# Patient Record
Sex: Female | Born: 1955 | Race: White | Hispanic: No | Marital: Married | Smoking: Never smoker
Health system: Southern US, Community
[De-identification: ages and names within clinical notes are randomized; demographics above are authoritative.]

## PROBLEM LIST (undated history)

## (undated) DIAGNOSIS — E785 Hyperlipidemia, unspecified: Secondary | ICD-10-CM

## (undated) DIAGNOSIS — E039 Hypothyroidism, unspecified: Secondary | ICD-10-CM

## (undated) DIAGNOSIS — T7840XA Allergy, unspecified, initial encounter: Secondary | ICD-10-CM

## (undated) DIAGNOSIS — Z800 Family history of malignant neoplasm of digestive organs: Secondary | ICD-10-CM

## (undated) DIAGNOSIS — E079 Disorder of thyroid, unspecified: Secondary | ICD-10-CM

## (undated) DIAGNOSIS — M069 Rheumatoid arthritis, unspecified: Secondary | ICD-10-CM

## (undated) DIAGNOSIS — R569 Unspecified convulsions: Secondary | ICD-10-CM

## (undated) DIAGNOSIS — G40909 Epilepsy, unspecified, not intractable, without status epilepticus: Secondary | ICD-10-CM

## (undated) HISTORY — DX: Family history of malignant neoplasm of digestive organs: Z80.0

## (undated) HISTORY — DX: Disorder of thyroid, unspecified: E07.9

## (undated) HISTORY — DX: Epilepsy, unspecified, not intractable, without status epilepticus: G40.909

## (undated) HISTORY — DX: Allergy, unspecified, initial encounter: T78.40XA

## (undated) HISTORY — PX: APPENDECTOMY: SHX54

## (undated) HISTORY — PX: COLONOSCOPY: SHX174

## (undated) HISTORY — PX: POLYPECTOMY: SHX149

## (undated) HISTORY — DX: Rheumatoid arthritis, unspecified: M06.9

## (undated) HISTORY — DX: Hyperlipidemia, unspecified: E78.5

---

## 1998-02-24 ENCOUNTER — Other Ambulatory Visit: Admission: RE | Admit: 1998-02-24 | Discharge: 1998-02-24 | Payer: Self-pay | Admitting: Family Medicine

## 1998-10-26 ENCOUNTER — Other Ambulatory Visit: Admission: RE | Admit: 1998-10-26 | Discharge: 1998-10-26 | Payer: Self-pay | Admitting: Obstetrics and Gynecology

## 2000-01-10 ENCOUNTER — Other Ambulatory Visit: Admission: RE | Admit: 2000-01-10 | Discharge: 2000-01-10 | Payer: Self-pay | Admitting: Obstetrics and Gynecology

## 2000-04-04 ENCOUNTER — Encounter: Payer: Self-pay | Admitting: Family Medicine

## 2000-04-04 ENCOUNTER — Encounter: Admission: RE | Admit: 2000-04-04 | Discharge: 2000-04-04 | Payer: Self-pay | Admitting: Family Medicine

## 2000-12-17 ENCOUNTER — Encounter (INDEPENDENT_AMBULATORY_CARE_PROVIDER_SITE_OTHER): Payer: Self-pay

## 2000-12-17 ENCOUNTER — Ambulatory Visit (HOSPITAL_COMMUNITY): Admission: RE | Admit: 2000-12-17 | Discharge: 2000-12-17 | Payer: Self-pay | Admitting: Obstetrics and Gynecology

## 2001-08-20 ENCOUNTER — Encounter: Admission: RE | Admit: 2001-08-20 | Discharge: 2001-08-20 | Payer: Self-pay | Admitting: Family Medicine

## 2001-08-20 ENCOUNTER — Encounter: Payer: Self-pay | Admitting: Family Medicine

## 2002-12-24 ENCOUNTER — Other Ambulatory Visit: Admission: RE | Admit: 2002-12-24 | Discharge: 2002-12-24 | Payer: Self-pay | Admitting: Obstetrics and Gynecology

## 2003-01-12 ENCOUNTER — Encounter: Admission: RE | Admit: 2003-01-12 | Discharge: 2003-01-12 | Payer: Self-pay | Admitting: Obstetrics and Gynecology

## 2003-01-12 ENCOUNTER — Encounter: Payer: Self-pay | Admitting: Obstetrics and Gynecology

## 2004-03-02 ENCOUNTER — Encounter: Admission: RE | Admit: 2004-03-02 | Discharge: 2004-03-02 | Payer: Self-pay | Admitting: Family Medicine

## 2004-04-06 ENCOUNTER — Other Ambulatory Visit: Admission: RE | Admit: 2004-04-06 | Discharge: 2004-04-06 | Payer: Self-pay | Admitting: Obstetrics and Gynecology

## 2005-04-12 ENCOUNTER — Other Ambulatory Visit: Admission: RE | Admit: 2005-04-12 | Discharge: 2005-04-12 | Payer: Self-pay | Admitting: Obstetrics and Gynecology

## 2006-04-16 ENCOUNTER — Other Ambulatory Visit: Admission: RE | Admit: 2006-04-16 | Discharge: 2006-04-16 | Payer: Self-pay | Admitting: Obstetrics and Gynecology

## 2008-05-26 ENCOUNTER — Encounter: Admission: RE | Admit: 2008-05-26 | Discharge: 2008-05-26 | Payer: Self-pay | Admitting: Obstetrics and Gynecology

## 2009-04-13 ENCOUNTER — Ambulatory Visit: Payer: Self-pay | Admitting: Gastroenterology

## 2009-04-26 ENCOUNTER — Ambulatory Visit: Payer: Self-pay | Admitting: Gastroenterology

## 2009-06-14 ENCOUNTER — Encounter: Admission: RE | Admit: 2009-06-14 | Discharge: 2009-06-14 | Payer: Self-pay | Admitting: Family Medicine

## 2009-07-13 ENCOUNTER — Encounter: Admission: RE | Admit: 2009-07-13 | Discharge: 2009-07-13 | Payer: Self-pay | Admitting: Family Medicine

## 2010-10-05 ENCOUNTER — Encounter: Admission: RE | Admit: 2010-10-05 | Discharge: 2010-10-05 | Payer: Self-pay | Admitting: Family Medicine

## 2010-10-19 ENCOUNTER — Encounter: Admission: RE | Admit: 2010-10-19 | Discharge: 2010-10-19 | Payer: Self-pay | Admitting: Family Medicine

## 2010-12-24 ENCOUNTER — Encounter: Payer: Self-pay | Admitting: Family Medicine

## 2011-04-21 NOTE — Op Note (Signed)
[facility]  Patient:    Melinda Sparks, Melinda Sparks                     MRN: [id number] Proc. Date: 12/17/00 Adm. Date:  [id number] Attending:  Michaele Offer                           Operative Report  PREOPERATIVE DIAGNOSIS:  Abnormal uterine bleeding.  POSTOPERATIVE DIAGNOSES: 1. Abnormal uterine bleeding. 2. Endometrial lesions.  PROCEDURES: 1. Dilatation and curettage. 2. Hysteroscopy. 3. Resection of endometrial lesions.  SURGEON:  Zenaida Niece, M.D.  ANESTHESIA:  General with LMA.  ESTIMATED BLOOD LOSS:  Less than 50 cc.  INPUT AND OUTPUT THROUGH THE HYSTEROSCOPE:  Approximately 200 cc deficit. This was difficult to determine as there was a hole in the drape, which allowed the output to drain on the floor.  FINDINGS:  She had a small mid plane to anteverted uterus of which the cervix was very difficult to dilate.  On hysteroscopy, there were two small lateral growths at the uterine fundus.  COUNTS:  Correct.  CONDITION:  Stable.  PROCEDURE IN DETAIL:  After appropriate informed consent was obtained, the patient was taken to the operating room and placed in the dorsosupine position.  General anesthesia was induced and she was placed in mobile stirrups.  Her perineum and vagina were then prepped and draped in the usual sterile fashion and her bladder drained with a red rubber catheter.  A weighted speculum was placed and a right angle retractor used anteriorly.  The anterior lip of the cervix was grasped with a single-tooth tenaculum and with some difficulty, the cervix was eventually dilated up to a size 21 Hegar dilator.  The Observer hysteroscope was inserted and good visualization was achieved.  The uterus again was noted to be small with two small lateral growths.  The Observe hysteroscope was then removed and the cervix again gradually dilated up to a size 33 Hegar dilator.  The resectoscope was inserted with some  difficulty.  The lateral lesions were identified and removed with an electrical loop.  This removed the lesions completely and with adequate hemostasis.  At the end of the procedure, there were no further endometrial lesions.  All instruments were then removed from the vagina.  The single-tooth tenaculum was removed and bleeding controlled with pressure.  The patient was awaken in the operating room, tolerated the procedure well, and was taken to the recovery room in stable condition. DD:  12/17/00 TD:  12/17/00 Job: [id number]

## 2011-09-26 ENCOUNTER — Other Ambulatory Visit: Payer: Self-pay | Admitting: Family Medicine

## 2011-09-26 DIAGNOSIS — Z1231 Encounter for screening mammogram for malignant neoplasm of breast: Secondary | ICD-10-CM

## 2011-10-23 ENCOUNTER — Ambulatory Visit
Admission: RE | Admit: 2011-10-23 | Discharge: 2011-10-23 | Disposition: A | Discharge status: Home / Self Care | Payer: Medicare HMO | Source: Ambulatory Visit | Attending: Family Medicine | Admitting: Family Medicine

## 2011-10-23 ENCOUNTER — Ambulatory Visit: Payer: Self-pay

## 2011-10-23 DIAGNOSIS — Z1231 Encounter for screening mammogram for malignant neoplasm of breast: Secondary | ICD-10-CM

## 2012-03-05 ENCOUNTER — Ambulatory Visit
Admission: RE | Admit: 2012-03-05 | Discharge: 2012-03-05 | Disposition: A | Discharge status: Home / Self Care | Payer: Medicare HMO | Source: Ambulatory Visit | Attending: Rheumatology | Admitting: Rheumatology

## 2012-03-05 ENCOUNTER — Other Ambulatory Visit: Payer: Self-pay | Admitting: Rheumatology

## 2012-03-05 DIAGNOSIS — M1990 Unspecified osteoarthritis, unspecified site: Secondary | ICD-10-CM

## 2012-09-16 ENCOUNTER — Other Ambulatory Visit: Payer: Self-pay | Admitting: Family Medicine

## 2012-09-16 DIAGNOSIS — Z1231 Encounter for screening mammogram for malignant neoplasm of breast: Secondary | ICD-10-CM

## 2012-10-28 ENCOUNTER — Ambulatory Visit
Admission: RE | Admit: 2012-10-28 | Discharge: 2012-10-28 | Disposition: A | Discharge status: Home / Self Care | Payer: Medicare HMO | Source: Ambulatory Visit | Attending: Family Medicine | Admitting: Family Medicine

## 2012-10-28 DIAGNOSIS — Z1231 Encounter for screening mammogram for malignant neoplasm of breast: Secondary | ICD-10-CM

## 2012-10-29 ENCOUNTER — Ambulatory Visit: Payer: Medicare HMO

## 2012-11-15 ENCOUNTER — Other Ambulatory Visit: Payer: Self-pay | Admitting: Family Medicine

## 2012-11-15 DIAGNOSIS — M810 Age-related osteoporosis without current pathological fracture: Secondary | ICD-10-CM

## 2012-12-02 ENCOUNTER — Other Ambulatory Visit: Payer: Medicare HMO

## 2012-12-09 ENCOUNTER — Other Ambulatory Visit: Payer: Medicare HMO

## 2012-12-17 ENCOUNTER — Ambulatory Visit
Admission: RE | Admit: 2012-12-17 | Discharge: 2012-12-17 | Disposition: A | Discharge status: Home / Self Care | Payer: Medicare HMO | Source: Ambulatory Visit | Attending: Family Medicine | Admitting: Family Medicine

## 2012-12-17 DIAGNOSIS — M810 Age-related osteoporosis without current pathological fracture: Secondary | ICD-10-CM

## 2013-08-24 ENCOUNTER — Emergency Department (HOSPITAL_COMMUNITY)
Admission: EM | Admit: 2013-08-24 | Discharge: 2013-08-25 | Disposition: A | Discharge status: Home / Self Care | Payer: Medicare HMO | Attending: Emergency Medicine | Admitting: Emergency Medicine

## 2013-08-24 ENCOUNTER — Emergency Department (HOSPITAL_COMMUNITY): Payer: Medicare HMO

## 2013-08-24 ENCOUNTER — Encounter (HOSPITAL_COMMUNITY): Payer: Self-pay | Admitting: Emergency Medicine

## 2013-08-24 DIAGNOSIS — Y939 Activity, unspecified: Secondary | ICD-10-CM | POA: Insufficient documentation

## 2013-08-24 DIAGNOSIS — Y9289 Other specified places as the place of occurrence of the external cause: Secondary | ICD-10-CM | POA: Insufficient documentation

## 2013-08-24 DIAGNOSIS — W1809XA Striking against other object with subsequent fall, initial encounter: Secondary | ICD-10-CM | POA: Insufficient documentation

## 2013-08-24 DIAGNOSIS — G40909 Epilepsy, unspecified, not intractable, without status epilepticus: Secondary | ICD-10-CM | POA: Insufficient documentation

## 2013-08-24 DIAGNOSIS — S0990XA Unspecified injury of head, initial encounter: Secondary | ICD-10-CM | POA: Insufficient documentation

## 2013-08-24 DIAGNOSIS — R569 Unspecified convulsions: Secondary | ICD-10-CM

## 2013-08-24 DIAGNOSIS — S0101XA Laceration without foreign body of scalp, initial encounter: Secondary | ICD-10-CM

## 2013-08-24 DIAGNOSIS — IMO0002 Reserved for concepts with insufficient information to code with codable children: Secondary | ICD-10-CM | POA: Insufficient documentation

## 2013-08-24 DIAGNOSIS — Z79899 Other long term (current) drug therapy: Secondary | ICD-10-CM | POA: Insufficient documentation

## 2013-08-24 DIAGNOSIS — S0100XA Unspecified open wound of scalp, initial encounter: Secondary | ICD-10-CM | POA: Insufficient documentation

## 2013-08-24 HISTORY — DX: Unspecified convulsions: R56.9

## 2013-08-24 LAB — BASIC METABOLIC PANEL
BUN: 17 mg/dL (ref 6–23)
Chloride: 103 mEq/L (ref 96–112)
GFR calc non Af Amer: 72 mL/min — ABNORMAL LOW (ref >90)
Glucose, Bld: 70 mg/dL (ref 70–99)
Potassium: 3.5 mEq/L (ref 3.5–5.1)
Sodium: 137 mEq/L (ref 135–145)

## 2013-08-24 MED ORDER — SODIUM CHLORIDE 0.9 % IV BOLUS (SEPSIS)
1000.0000 mL | Freq: Once | INTRAVENOUS | Status: AC
  Administered 2013-08-24: 1000 mL via INTRAVENOUS

## 2013-08-24 NOTE — ED Notes (Signed)
Patients jewelry taken off and given to the husband and his mother. The tech has reported to the RN in charge.

## 2013-08-24 NOTE — ED Provider Notes (Signed)
CSN: [id number]     Arrival date & time 08/24/13  1851 History   First MD Initiated Contact with Patient 08/24/13 1905     Chief Complaint  Patient presents with  . Seizures   (Consider location/radiation/quality/duration/timing/severity/associated sxs/prior Treatment) HPI Comments: 57 yo female with seizure hx, no etoh or smoking, pt has been on lamotrigine for years for seizures.  They come in groups usually three and then she has a longer break without seizures.  This is her third in 24 hrs.  She has neuro fup outpt.  No changes in meds or missed doses.  No recent infections.   Pt post ictal, seizure lasted a few minutes PTA.  Scalp lac.  Improves with time.   Patient is a 57 y.o. female presenting with seizures. The history is provided by the patient and a relative.  Seizures   Past Medical History  Diagnosis Date  . Seizures    History reviewed. No pertinent past surgical history. No family history on file. History  Substance Use Topics  . Smoking status: Never Smoker   . Smokeless tobacco: Not on file  . Alcohol Use: Not on file   OB History   Grav Para Term Preterm Abortions TAB SAB Ect Mult Living                 Review of Systems  Constitutional: Negative for fever and chills.  HENT: Negative for neck pain and neck stiffness.   Eyes: Negative for visual disturbance.  Respiratory: Negative for shortness of breath.   Cardiovascular: Negative for chest pain.  Gastrointestinal: Negative for vomiting and abdominal pain.  Genitourinary: Negative for dysuria and flank pain.  Musculoskeletal: Negative for back pain.  Skin: Positive for wound. Negative for rash.  Neurological: Positive for seizures and headaches. Negative for light-headedness.    Allergies  Review of patient's allergies indicates no known allergies.  Home Medications   Current Outpatient Rx  Name  Route  Sig  Dispense  Refill  . alendronate (FOSAMAX) 10 MG tablet   Oral   Take 10 mg by mouth  daily before breakfast. Take with a full glass of water on an empty stomach.         Marland Kitchen ascorbic acid (VITAMIN C) 1000 MG tablet   Oral   Take 1,000 mg by mouth daily.         Marland Kitchen BIOTIN PO   Oral   Take 3,000 mcg by mouth daily.         . Cholecalciferol (VITAMIN D) 2000 UNITS tablet   Oral   Take 2,000 Units by mouth every evening.         . fluticasone (FLONASE) 50 MCG/ACT nasal spray   Nasal   Place 2 sprays into the nose at bedtime.         . folic acid (FOLVITE) 400 MCG tablet   Oral   Take 400-1,600 mcg by mouth See admin instructions. Takes 2 tablets in the morning, and 1 tablet with lunch.         . hydroxychloroquine (PLAQUENIL) 200 MG tablet   Oral   Take 200 mg by mouth daily.         Marland Kitchen lamoTRIgine (LAMICTAL) 100 MG tablet   Oral   Take 200 mg by mouth 2 (two) times daily.         Marland Kitchen levothyroxine (SYNTHROID, LEVOTHROID) 75 MCG tablet   Oral   Take 75 mcg by mouth at bedtime.         Marland Kitchen  Multiple Vitamins-Minerals (CENTRUM SILVER ADULT 50+) TABS   Oral   Take 1 tablet by mouth every evening.         . Multiple Vitamins-Minerals (MULTI FOR HER PO)   Oral   Take 1 tablet by mouth daily at 12 noon. One-a-day Womens'         . psyllium (METAMUCIL) 58.6 % packet   Oral   Take 1 packet by mouth every evening.         . simvastatin (ZOCOR) 20 MG tablet   Oral   Take 20 mg by mouth daily at 12 noon.          BP 148/81  Pulse 107  Temp(Src) 97.7 F (36.5 C) (Oral)  Resp 32  SpO2 100% Physical Exam  Nursing note and vitals reviewed. Constitutional: She is oriented to person, place, and time. She appears well-developed and well-nourished.  HENT:  Head: Normocephalic and atraumatic.  Eyes: Conjunctivae are normal. Right eye exhibits no discharge. Left eye exhibits no discharge.  Neck: Normal range of motion. Neck supple. No tracheal deviation present.  Cardiovascular: Normal rate and regular rhythm.   Pulmonary/Chest: Effort normal  and breath sounds normal.  Abdominal: Soft. She exhibits no distension. There is no tenderness. There is no guarding.  Musculoskeletal: She exhibits tenderness. She exhibits no edema.  Neurological: She is alert and oriented to person, place, and time.  5+ strength in UE and LE with f/e at major joints. Sensation to palpation intact in UE and LE. CNs 2-12 grossly intact.  EOMFI.  PERRL.   Finger nose and coordination intact bilateral.   Visual fields intact to finger testing.   Skin: Skin is warm. No rash noted.  1 cm scalp lac, mild bleeding left posterior  Psychiatric: She has a normal mood and affect.    ED Course  Procedures (including critical care time) Labs Review Labs Reviewed  BASIC METABOLIC PANEL - Abnormal; Notable for the following:    GFR calc non Af Amer 72 (*)    GFR calc Af Amer 83 (*)    All other components within normal limits  GLUCOSE, CAPILLARY   Imaging Review Ct Head Wo Contrast  08/24/2013   CLINICAL DATA:  Fall.  EXAM: CT HEAD WITHOUT CONTRAST  CT CERVICAL SPINE WITHOUT CONTRAST  TECHNIQUE: Multidetector CT imaging of the head and cervical spine was performed following the standard protocol without intravenous contrast. Multiplanar CT image reconstructions of the cervical spine were also generated.  COMPARISON:  None.  FINDINGS: CT HEAD FINDINGS  No evidence of acute intracranial abnormality including infarction, hemorrhage, mass lesion, mass effect, midline shift or abnormal extra-axial fluid collection is identified. There is no pneumocephalus or hydrocephalus. The calvarium is intact.  CT CERVICAL SPINE FINDINGS  No fracture or subluxation of the cervical spine is identified. Intervertebral disc space height is maintained. Marked facet arthropathy is seen on the left at C4-5. The lung apices are clear.  IMPRESSION: CT HEAD IMPRESSION  No acute intracranial abnormality.  CT CERVICAL SPINE IMPRESSION  No acute finding. 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   Electronically Signed   By: Drusilla Kanner M.D.   On: 08/24/2013 21:16   Ct Cervical Spine Wo Contrast  08/24/2013   CLINICAL DATA:  Fall.  EXAM: CT HEAD WITHOUT CONTRAST  CT CERVICAL SPINE WITHOUT CONTRAST  TECHNIQUE: Multidetector CT imaging of the head and cervical spine was performed following the standard protocol without intravenous contrast. Multiplanar  CT image reconstructions of the cervical spine were also generated.  COMPARISON:  None.  FINDINGS: CT HEAD FINDINGS  No evidence of acute intracranial abnormality including infarction, hemorrhage, mass lesion, mass effect, midline shift or abnormal extra-axial fluid collection is identified. There is no pneumocephalus or hydrocephalus. The calvarium is intact.  CT CERVICAL SPINE FINDINGS  No fracture or subluxation of the cervical spine is identified. Intervertebral disc space height is maintained. Marked facet arthropathy is seen on the left at C4-5. The lung apices are clear.  IMPRESSION: CT HEAD IMPRESSION  No acute intracranial abnormality.  CT CERVICAL SPINE IMPRESSION  No acute finding. Advanced facet degenerative disease of the left at C4-5 is noted.   Electronically Signed   By: Drusilla Kanner M.D.   On: 08/24/2013 21:16    MDM   1. Seizure   2. Scalp laceration, initial encounter   3. Head injury, acute, initial encounter    Known hx, similar to previous.  Family with pt.  Observed in ED, no recurrent seizures. CT no acute findings. Discussed observation vs outpt fup, pt and husband chose outpt fup. Scalp lac, pt will hold pressure, no staples at this time. Wound cleaned.  DC discussed.    Enid Skeens, MD 08/24/13 [phone]

## 2013-08-24 NOTE — ED Notes (Signed)
Seizure pads removed when pt went to CT.  Pads put back on stretcher.

## 2013-08-24 NOTE — ED Notes (Signed)
Pt by EMS for seizure.  Hx of same. Pt's husband informed EMS that pt had a seizure last night.  Today, while at [employer], pt had seizure and hit concrete floor at [employer]. Pt post ictal upon EMS arrival. Lac to head, wrapped in guaze upon arrival to ED.  Pt given 5mg  Versed en route. Initially, EMS could not assess pupils d/t pt being uncooperative.  Upon arrival to [facility], Pupils equal and reactive but sluggish. Abrasion to L tibia. CBG was 66, EMS gave 1/2 amp D50 and cbg increased to 170. BP 126/86, HR 104.

## 2013-10-20 ENCOUNTER — Other Ambulatory Visit: Payer: Self-pay

## 2013-10-20 DIAGNOSIS — Z1231 Encounter for screening mammogram for malignant neoplasm of breast: Secondary | ICD-10-CM

## 2013-11-20 ENCOUNTER — Ambulatory Visit
Admission: RE | Admit: 2013-11-20 | Discharge: 2013-11-20 | Disposition: A | Discharge status: Home / Self Care | Payer: Medicare HMO | Source: Ambulatory Visit

## 2013-11-20 DIAGNOSIS — Z1231 Encounter for screening mammogram for malignant neoplasm of breast: Secondary | ICD-10-CM

## 2014-02-06 ENCOUNTER — Encounter: Payer: Self-pay | Admitting: Gastroenterology

## 2014-03-23 ENCOUNTER — Ambulatory Visit (AMBULATORY_SURGERY_CENTER): Payer: Self-pay | Admitting: *Deleted

## 2014-03-23 ENCOUNTER — Telehealth: Payer: Self-pay | Admitting: *Deleted

## 2014-03-23 VITALS — Ht 63.5 in | Wt 109.6 lb

## 2014-03-23 DIAGNOSIS — Z800 Family history of malignant neoplasm of digestive organs: Secondary | ICD-10-CM

## 2014-03-23 MED ORDER — MOVIPREP 100 G PO SOLR: ORAL | Status: DC

## 2014-03-23 NOTE — Telephone Encounter (Signed)
Noted. Called and notified patient's husband. He understands.

## 2014-03-23 NOTE — Telephone Encounter (Signed)
Patient is for recall colonoscopy on 04/06/14 at [facility]. Patient has epilepsy. During pre-visit patient and spouse states her last seizure was 3 weeks ago. They both state she has had epilepsy since "high school". Patient states she gets seizures about 2 per month, usually same week, then no more that month. Patient is currently taking medications for this and does not miss dose. Okay for [place] colonoscopy? Please call husband for any questions or concerns for this matter. He states she does not remember well. Thanks,Mellissa Conley, PV

## 2014-03-23 NOTE — Telephone Encounter (Signed)
Shirlean Mylar,  This pt is cleared for [facility]  Thanks,  [employer]

## 2014-03-23 NOTE — Progress Notes (Signed)
Patient denies any allergies to eggs or soy. Patient denies any problems with anesthesia. No oxygen use at home. No diet pills. See phone note sent to John Nulty,CRNA about patient's seizure history.

## 2014-04-01 ENCOUNTER — Telehealth: Payer: Self-pay | Admitting: Gastroenterology

## 2014-04-01 NOTE — Telephone Encounter (Signed)
Spoke with patient. Answered questions. No further questions. Patient verbalizes understanding.

## 2014-04-06 ENCOUNTER — Encounter: Payer: Self-pay | Admitting: Gastroenterology

## 2014-04-06 ENCOUNTER — Ambulatory Visit (AMBULATORY_SURGERY_CENTER): Payer: Medicare HMO | Admitting: Gastroenterology

## 2014-04-06 VITALS — BP 115/76 | HR 76 | Temp 96.3°F | Resp 17 | Ht 63.0 in | Wt 109.0 lb

## 2014-04-06 DIAGNOSIS — D126 Benign neoplasm of colon, unspecified: Secondary | ICD-10-CM

## 2014-04-06 DIAGNOSIS — Z800 Family history of malignant neoplasm of digestive organs: Secondary | ICD-10-CM

## 2014-04-06 DIAGNOSIS — Z1211 Encounter for screening for malignant neoplasm of colon: Secondary | ICD-10-CM

## 2014-04-06 MED ORDER — SODIUM CHLORIDE 0.9 % IV SOLN: 500.0000 mL | INTRAVENOUS | Status: DC

## 2014-04-06 NOTE — Progress Notes (Signed)
Called to room to assist during endoscopic procedure.  Patient ID and intended procedure confirmed with present staff. Received instructions for my participation in the procedure from the performing physician.  

## 2014-04-06 NOTE — Progress Notes (Signed)
seizure on March 28, 2014 per patient and husband. Dr. Ardis Hughs informed .

## 2014-04-06 NOTE — Op Note (Signed)
[place]  [address] [place], [address]   COLONOSCOPY PROCEDURE REPORT  PATIENT: Melinda Sparks, Melinda Sparks  MR#: [id number] BIRTHDATE: Feb 12, 1956 , 36  yrs. old GENDER: Female ENDOSCOPIST: Milus Banister, MD PROCEDURE DATE:  04/06/2014 PROCEDURE:   Colonoscopy with snare polypectomy First Screening Colonoscopy - Avg.  risk and is 50 yrs.  old or older - No.  Prior Negative Screening - Now for repeat screening. N/A  History of Adenoma - Now for follow-up colonoscopy & has been > or = to 3 yrs.  N/A  Polyps Removed Today? Yes. ASA CLASS:   Class II INDICATIONS:elevated risk screening, father had colon cancer MEDICATIONS: MAC sedation, administered by CRNA and propofol (Diprivan) 200mg  IV  DESCRIPTION OF PROCEDURE:   After the risks benefits and alternatives of the procedure were thoroughly explained, informed consent was obtained.  A digital rectal exam revealed no abnormalities of the rectum.   The [id number]  endoscope was introduced through the anus and advanced to the cecum, which was identified by both the appendix and ileocecal valve. No adverse events experienced.   The quality of the prep was good.  The instrument was then slowly withdrawn as the colon was fully examined.   COLON FINDINGS: One polyp was found, removed and sent to pathology. This was sessile, 19mm across, located in ascending segment, removed with cold snare.  The examination was otherwise normal. Retroflexed views revealed no abnormalities. The time to cecum=10 minutes 36 seconds.  Withdrawal time=6 minutes 15 seconds.  The scope was withdrawn and the procedure completed. COMPLICATIONS: There were no complications.  ENDOSCOPIC IMPRESSION: One polyp was found, removed and sent to pathology.The examination was otherwise normal.  RECOMMENDATIONS: Given your significant family history of colon cancer, you should have a repeat colonoscopy in 5 years even if the polyp  removed today is NOT precancerous.  You will receive a letter within 1-2 weeks with the results of your biopsy as well as final recommendations.  Please call my office if you have not received a letter after 3 weeks.   eSigned:  Milus Banister, MD 04/06/2014 11:26 AM   cc:  Bernerd Limbo, MD

## 2014-04-06 NOTE — Progress Notes (Signed)
Procedure ends, to recovery, report given and VSS. 

## 2014-04-06 NOTE — Patient Instructions (Signed)
YOU HAD AN ENDOSCOPIC PROCEDURE TODAY AT THE  ENDOSCOPY CENTER: Refer to the procedure report that was given to you for any specific questions about what was found during the examination.  If the procedure report does not answer your questions, please call your gastroenterologist to clarify.  If you requested that your care partner not be given the details of your procedure findings, then the procedure report has been included in a sealed envelope for you to review at your convenience later.  YOU SHOULD EXPECT: Some feelings of bloating in the abdomen. Passage of more gas than usual.  Walking can help get rid of the air that was put into your GI tract during the procedure and reduce the bloating. If you had a lower endoscopy (such as a colonoscopy or flexible sigmoidoscopy) you may notice spotting of blood in your stool or on the toilet paper. If you underwent a bowel prep for your procedure, then you may not have a normal bowel movement for a few days.  DIET: Your first meal following the procedure should be a light meal and then it is ok to progress to your normal diet.  A half-sandwich or bowl of soup is an example of a good first meal.  Heavy or fried foods are harder to digest and may make you feel nauseous or bloated.  Likewise meals heavy in dairy and vegetables can cause extra gas to form and this can also increase the bloating.  Drink plenty of fluids but you should avoid alcoholic beverages for 24 hours.  ACTIVITY: Your care partner should take you home directly after the procedure.  You should plan to take it easy, moving slowly for the rest of the day.  You can resume normal activity the day after the procedure however you should NOT DRIVE or use heavy machinery for 24 hours (because of the sedation medicines used during the test).    SYMPTOMS TO REPORT IMMEDIATELY: A gastroenterologist can be reached at any hour.  During normal business hours, 8:30 AM to 5:00 PM Monday through Friday,  call [phone].  After hours and on weekends, please call the GI answering service at [phone] who will take a message and have the physician on call contact you.   Following lower endoscopy (colonoscopy or flexible sigmoidoscopy):  Excessive amounts of blood in the stool  Significant tenderness or worsening of abdominal pains  Swelling of the abdomen that is new, acute  Fever of 100F or higher  FOLLOW UP: If any biopsies were taken you will be contacted by phone or by letter within the next 1-3 weeks.  Call your gastroenterologist if you have not heard about the biopsies in 3 weeks.  Our staff will call the home number listed on your records the next business day following your procedure to check on you and address any questions or concerns that you may have at that time regarding the information given to you following your procedure. This is a courtesy call and so if there is no answer at the home number and we have not heard from you through the emergency physician on call, we will assume that you have returned to your regular daily activities without incident.  SIGNATURES/CONFIDENTIALITY: You and/or your care partner have signed paperwork which will be entered into your electronic medical record.  These signatures attest to the fact that that the information above on your After Visit Summary has been reviewed and is understood.  Full responsibility of the confidentiality of this   discharge information lies with you and/or your care-partner.  Polyp handout given to patient.

## 2014-04-07 ENCOUNTER — Telehealth: Payer: Self-pay | Admitting: *Deleted

## 2014-04-07 NOTE — Telephone Encounter (Signed)
  Follow up Call-  Call back number 04/06/2014  Post procedure Call Back phone  # [phone]  Permission to leave phone message Yes     Patient questions:  Do you have a fever, pain , or abdominal swelling? no Pain Score  0 *  Have you tolerated food without any problems? yes  Have you been able to return to your normal activities? yes  Do you have any questions about your discharge instructions: Diet   no Medications  no Follow up visit  no  Do you have questions or concerns about your Care? no  Actions: * If pain score is 4 or above: No action needed, pain <4.

## 2014-04-14 ENCOUNTER — Encounter: Payer: Self-pay | Admitting: Gastroenterology

## 2014-12-31 ENCOUNTER — Other Ambulatory Visit: Payer: Self-pay

## 2014-12-31 DIAGNOSIS — Z1231 Encounter for screening mammogram for malignant neoplasm of breast: Secondary | ICD-10-CM

## 2015-01-18 ENCOUNTER — Ambulatory Visit: Payer: Medicare HMO

## 2015-01-29 ENCOUNTER — Ambulatory Visit: Payer: Medicare HMO

## 2015-01-29 ENCOUNTER — Ambulatory Visit
Admission: RE | Admit: 2015-01-29 | Discharge: 2015-01-29 | Disposition: A | Discharge status: Home / Self Care | Payer: Medicare HMO | Source: Ambulatory Visit

## 2015-01-29 DIAGNOSIS — Z1231 Encounter for screening mammogram for malignant neoplasm of breast: Secondary | ICD-10-CM

## 2015-06-18 ENCOUNTER — Encounter: Payer: Self-pay | Admitting: Gastroenterology

## 2015-12-29 ENCOUNTER — Other Ambulatory Visit: Payer: Self-pay

## 2015-12-29 DIAGNOSIS — Z1231 Encounter for screening mammogram for malignant neoplasm of breast: Secondary | ICD-10-CM

## 2016-01-31 ENCOUNTER — Ambulatory Visit
Admission: RE | Admit: 2016-01-31 | Discharge: 2016-01-31 | Disposition: A | Discharge status: Home / Self Care | Payer: Medicare HMO | Source: Ambulatory Visit

## 2016-01-31 ENCOUNTER — Ambulatory Visit: Payer: Medicare HMO

## 2016-01-31 DIAGNOSIS — Z1231 Encounter for screening mammogram for malignant neoplasm of breast: Secondary | ICD-10-CM

## 2016-10-06 ENCOUNTER — Telehealth: Payer: Self-pay | Admitting: Rheumatology

## 2016-10-06 NOTE — Telephone Encounter (Signed)
Does patient need any lab work done prior to her scheduled appointment next week or are her labs up to date? Please advise.

## 2016-10-07 NOTE — Telephone Encounter (Signed)
Yes, her labs are due, we can draw blood when she is here for follow up, I will call her

## 2016-10-09 ENCOUNTER — Telehealth: Payer: Self-pay | Admitting: Rheumatology

## 2016-10-10 NOTE — Telephone Encounter (Signed)
Called patient to advise.  Left message

## 2016-10-12 ENCOUNTER — Other Ambulatory Visit: Payer: Self-pay | Admitting: Radiology

## 2016-10-12 ENCOUNTER — Ambulatory Visit: Payer: Self-pay | Admitting: Rheumatology

## 2016-10-12 ENCOUNTER — Other Ambulatory Visit: Payer: Self-pay | Admitting: Rheumatology

## 2016-10-12 DIAGNOSIS — Z79899 Other long term (current) drug therapy: Secondary | ICD-10-CM

## 2016-10-12 LAB — CBC WITH DIFFERENTIAL/PLATELET
BASOS PCT: 1 %
Basophils Absolute: 58 cells/uL (ref 0–200)
Eosinophils Absolute: 174 cells/uL (ref 15–500)
Eosinophils Relative: 3 %
HCT: 40.4 % (ref 35.0–45.0)
Hemoglobin: 13.2 g/dL (ref 11.7–15.5)
LYMPHS PCT: 22 %
Lymphs Abs: 1276 cells/uL (ref 850–3900)
MCH: 30.3 pg (ref 27.0–33.0)
MCHC: 32.7 g/dL (ref 32.0–36.0)
MCV: 92.7 fL (ref 80.0–100.0)
MONOS PCT: 13 %
MPV: 8.9 fL (ref 7.5–12.5)
Monocytes Absolute: 754 cells/uL (ref 200–950)
Neutro Abs: 3538 cells/uL (ref 1500–7800)
Neutrophils Relative %: 61 %
PLATELETS: 305 10*3/uL (ref 140–400)
RBC: 4.36 MIL/uL (ref 3.80–5.10)
RDW: 13 % (ref 11.0–15.0)
WBC: 5.8 10*3/uL (ref 3.8–10.8)

## 2016-10-13 ENCOUNTER — Telehealth: Payer: Self-pay | Admitting: Radiology

## 2016-10-13 LAB — COMPLETE METABOLIC PANEL WITH GFR
ALT: 20 U/L (ref 6–29)
AST: 24 U/L (ref 10–35)
Albumin: 4.4 g/dL (ref 3.6–5.1)
Alkaline Phosphatase: 27 U/L — ABNORMAL LOW (ref 33–130)
BILIRUBIN TOTAL: 0.4 mg/dL (ref 0.2–1.2)
BUN: 16 mg/dL (ref 7–25)
CHLORIDE: 100 mmol/L (ref 98–110)
CO2: 25 mmol/L (ref 20–31)
CREATININE: 1.03 mg/dL (ref 0.50–1.05)
Calcium: 9.7 mg/dL (ref 8.6–10.4)
GFR, Est African American: 69 mL/min (ref >=60)
GFR, Est Non African American: 60 mL/min (ref >=60)
GLUCOSE: 88 mg/dL (ref 65–99)
Potassium: 4.3 mmol/L (ref 3.5–5.3)
SODIUM: 139 mmol/L (ref 135–146)
Total Protein: 7.3 g/dL (ref 6.1–8.1)

## 2016-10-13 NOTE — Telephone Encounter (Signed)
-----   Message from Bo Merino, MD sent at 10/13/2016 12:28 PM EST ----- Normal labs

## 2016-10-13 NOTE — Telephone Encounter (Signed)
I have called patient to advise labs are normal  

## 2016-10-13 NOTE — Progress Notes (Signed)
Normal labs.

## 2016-10-25 ENCOUNTER — Ambulatory Visit: Payer: Self-pay | Admitting: Rheumatology

## 2016-11-07 NOTE — Telephone Encounter (Signed)
OPENED IN ERROR

## 2016-11-13 DIAGNOSIS — M0579 Rheumatoid arthritis with rheumatoid factor of multiple sites without organ or systems involvement: Secondary | ICD-10-CM | POA: Insufficient documentation

## 2016-11-13 NOTE — Progress Notes (Signed)
Office Visit Note  Patient: Melinda Sparks             Date of Birth: 09-Jan-1956           MRN: [id number]             PCP: Phineas Inches, MD Referring: Bernerd Limbo, MD Visit Date: 11/15/2016 Occupation: '@GUAROCC' @    Subjective:  No chief complaint on file. Follow-up on rheumatoid arthritis and high risk prescription  History of Present Illness: Melinda Sparks is a 60 y.o. female  Last seen 04/20/2016. Patient is doing relatively well with her rheumatoid arthritis. She has no joint pain swelling or stiffness. She is only on Plaquenil 200 mg once a day. He weighs 110 pounds since visit in the 106 pounds and this visit. We did have her on methotrexate 4 pills per week noted on the last visit but as of August 2017 her kidney function became abnormal so we stopped the methotrexate.  She is doing well without the methotrexate and her kidney function is returned to normal.  She also suffers from osteoarthritis which is causing her some discomfort to her hands. On the last visit as well as this visit, patient states that the Aspercreme helps her a lot.  Patient recently saw her eye doctor and had a Plaquenil eye exam done approximately November 2017 it was normal according to the patient  Activities of Daily Living:  Patient reports morning stiffness for 15 minutes.   Patient Denies nocturnal pain.  Difficulty dressing/grooming: Denies Difficulty climbing stairs: Denies Difficulty getting out of chair: Denies Difficulty using hands for taps, buttons, cutlery, and/or writing: Reports   No Rheumatology ROS completed.   PMFS History:  Patient Active Problem List   Diagnosis Date Noted  . Rheumatoid arthritis involving multiple sites with positive rheumatoid factor ([place]) 11/13/2016    Past Medical History:  Diagnosis Date  . Rheumatoid arthritis, adult ([place])   . 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   . Thyroid disease     Family History  Problem Relation Age of  Onset  . Colon cancer Father 44   Past Surgical History:  Procedure Laterality Date  . APPENDECTOMY     Social History   Social History Narrative  . No narrative on file     Objective: Vital Signs: BP 137/80 (BP Location: Left Arm, Patient Position: Sitting, Cuff Size: Normal)   Pulse 80   Resp 14   Ht '5\' 4"'  (1.626 m)   Wt 106 lb (48.1 kg)   BMI 18.19 kg/m    Physical Exam   Musculoskeletal Exam:  Full range of motion of all joints Grip strength is equal and strong bilaterally Fiber myalgia tender points are absent  CDAI Exam: CDAI Homunculus Exam:   Joint Counts:  CDAI Tender Joint count: 0 CDAI Swollen Joint count: 0  No synovitis on examination   Investigation: Findings:  10/21/2015 normal PLQ eye exam 04/20/16 RAPID3 shows a raw score of 2.8 with an index of 1, which was consistent with near remission.  10/21/2015  X-rays of the bilateral hands, 2 views, versus April 2015 show bilateral intercarpal, radiocarpal, and ulnar carpal joint space narrowing.  No ulnar styloid erosion.  There was bilateral DIP and PIP joint space narrowing.  No change versus April 2015.   X-rays of the bilateral feet, 2 views, versus April 2015 show bilateral intratarsal joint space narrowing, bilateral first MTP narrowing, and bilateral PIP and DIP narrowing.  No  erosions.  No heel spurs.  No change versus April 2015.  11/01/2011 G6PD normal/ hepatitis acute screening panel negative   Patient went to Dr. Velvet Bathe office November 2017 to get the Plaquenil eye exam and it was normal. They will have the documentation sent to our office.  Lab on 10/12/2016  Component Date Value Ref Range Status  . WBC 10/12/2016 5.8  3.8 - 10.8 K/uL Final  . RBC 10/12/2016 4.36  3.80 - 5.10 MIL/uL Final  . Hemoglobin 10/12/2016 13.2  11.7 - 15.5 g/dL Final  . HCT 10/12/2016 40.4  35.0 - 45.0 % Final  . MCV 10/12/2016 92.7  80.0 - 100.0 fL Final  . MCH 10/12/2016 30.3  27.0 - 33.0 pg Final  . MCHC  10/12/2016 32.7  32.0 - 36.0 g/dL Final  . RDW 10/12/2016 13.0  11.0 - 15.0 % Final  . Platelets 10/12/2016 305  140 - 400 K/uL Final  . MPV 10/12/2016 8.9  7.5 - 12.5 fL Final  . Neutro Abs 10/12/2016 3538  1,500 - 7,800 cells/uL Final  . Lymphs Abs 10/12/2016 1276  850 - 3,900 cells/uL Final  . Monocytes Absolute 10/12/2016 754  200 - 950 cells/uL Final  . Eosinophils Absolute 10/12/2016 174  15 - 500 cells/uL Final  . Basophils Absolute 10/12/2016 58  0 - 200 cells/uL Final  . Neutrophils Relative % 10/12/2016 61  % Final  . Lymphocytes Relative 10/12/2016 22  % Final  . Monocytes Relative 10/12/2016 13  % Final  . Eosinophils Relative 10/12/2016 3  % Final  . Basophils Relative 10/12/2016 1  % Final  . Smear Review 10/12/2016 Criteria for review not met   Final  . Sodium 10/13/2016 139  135 - 146 mmol/L Final  . Potassium 10/13/2016 4.3  3.5 - 5.3 mmol/L Final  . Chloride 10/13/2016 100  98 - 110 mmol/L Final  . CO2 10/13/2016 25  20 - 31 mmol/L Final  . Glucose, Bld 10/13/2016 88  65 - 99 mg/dL Final  . BUN 10/13/2016 16  7 - 25 mg/dL Final  . Creat 10/13/2016 1.03  0.50 - 1.05 mg/dL Final   Comment:   For patients > or = 59 years of age: The upper reference limit for Creatinine is approximately 13% higher for people identified as African-American.     . Total Bilirubin 10/13/2016 0.4  0.2 - 1.2 mg/dL Final  . Alkaline Phosphatase 10/13/2016 27* 33 - 130 U/L Final  . AST 10/13/2016 24  10 - 35 U/L Final  . ALT 10/13/2016 20  6 - 29 U/L Final  . Total Protein 10/13/2016 7.3  6.1 - 8.1 g/dL Final  . Albumin 10/13/2016 4.4  3.6 - 5.1 g/dL Final  . Calcium 10/13/2016 9.7  8.6 - 10.4 mg/dL Final  . GFR, Est African American 10/13/2016 69  >=60 mL/min Final  . GFR, Est Non African American 10/13/2016 60  >=60 mL/min Final    Imaging: No results found.  Speciality Comments: No specialty comments available.    Procedures:  No procedures performed Allergies: Felbamate;  Tiagabine; and Zonisamide   Assessment / Plan:     Visit Diagnoses: Rheumatoid arthritis involving multiple sites with positive rheumatoid factor (HCC)  High risk medication use - PLQ 200 qd;   normal PLQ eye exam 11/17;  STOPPED mtx due to Incr kidney function appr aug 2017; - Plan: hydroxychloroquine (PLAQUENIL) 200 MG tablet, CBC with Differential/Platelet, COMPLETE METABOLIC PANEL WITH GFR  Primary osteoarthritis of both  hands - Right second MCP pain that radiates to her right second PIP.  Pain in right hand  Age-related osteoporosis without current pathological fracture - PCP fosamax    Patient went to Dr. Zenia Resides office November 2017 to get the Plaquenil eye exam and it was normal. They will have the documentation sent to our office.    Labs in November are normal that include CBC with differential CMP with GFR  Return to clinic in 5 months  I refill Plaquenil 200 mg daily. She is off of methotrexate now. It was affecting her kidney function. After stopping the methotrexate patient continues to do well with her RA and her kidney function is returning to normal.  She does have some hand pain consistent with osteoarthritis especially to the right second MCP joint. She does well with Aspercreme and she will use asked cream as needed.  I've given her a printed Rx of Voltaren gel in case Aspercreme no longer works.   Plan: Return to clinic in 4 months and we'll do CBC with differential CMP with GFR at that visit.  After the next visit she'll return back in 5 months and get blood drawn at each of those visits since she is only on Plaquenil. If the labs are abnormal, then the patient is happy to get the labs drawn before the visit so we can discuss the results at the time of the visit. Note that the patient does not drive and her husband has to accompany her at each visit.  Since patient is having some hand pain, I showed her how to do some hand exercises. I've also given her handout  on hand exercises.  Orders: Orders Placed This Encounter  Procedures  . CBC with Differential/Platelet  . COMPLETE METABOLIC PANEL WITH GFR   Meds ordered this encounter  Medications  . diclofenac sodium (VOLTAREN) 1 % GEL    Sig: Voltaren Gel 3 grams to 3 large joints upto TID 3 TUBES with 3 refills    Dispense:  3 Tube    Refill:  3    Order Specific Question:   Supervising Provider    Answer:   Bo Merino [[id number]]  . hydroxychloroquine (PLAQUENIL) 200 MG tablet    Sig: Take 1 tablet (200 mg total) by mouth daily.    Dispense:  90 tablet    Refill:  1    Order Specific Question:   Supervising Provider    Answer:   Bo Merino [phone]    Face-to-face time spent with patient was 30 minutes. 50% of time was spent in counseling and coordination of care.  Follow-Up Instructions: Return in about 4 months (around 03/16/2017) for RA,plq 200 qd only, Oporosis, oa hands , Right  2nd mcp pain.   Eliezer Lofts, PA-C   I examined and evaluated the patient with Eliezer Lofts PA. The plan of care was discussed as noted above.  Bo Merino, MD

## 2016-11-14 ENCOUNTER — Other Ambulatory Visit: Payer: Self-pay | Admitting: Rheumatology

## 2016-11-15 ENCOUNTER — Encounter: Payer: Self-pay | Admitting: Rheumatology

## 2016-11-15 ENCOUNTER — Ambulatory Visit (INDEPENDENT_AMBULATORY_CARE_PROVIDER_SITE_OTHER): Payer: Medicare HMO | Admitting: Rheumatology

## 2016-11-15 VITALS — BP 137/80 | HR 80 | Resp 14 | Ht 64.0 in | Wt 106.0 lb

## 2016-11-15 DIAGNOSIS — M810 Age-related osteoporosis without current pathological fracture: Secondary | ICD-10-CM

## 2016-11-15 DIAGNOSIS — M0579 Rheumatoid arthritis with rheumatoid factor of multiple sites without organ or systems involvement: Secondary | ICD-10-CM

## 2016-11-15 DIAGNOSIS — Z79899 Other long term (current) drug therapy: Secondary | ICD-10-CM

## 2016-11-15 DIAGNOSIS — M79641 Pain in right hand: Secondary | ICD-10-CM | POA: Diagnosis not present

## 2016-11-15 DIAGNOSIS — M19042 Primary osteoarthritis, left hand: Secondary | ICD-10-CM

## 2016-11-15 DIAGNOSIS — M19041 Primary osteoarthritis, right hand: Secondary | ICD-10-CM

## 2016-11-15 MED ORDER — DICLOFENAC SODIUM 1 % TD GEL: TRANSDERMAL | 3 refills | Status: DC

## 2016-11-15 MED ORDER — HYDROXYCHLOROQUINE SULFATE 200 MG PO TABS: 200.0000 mg | ORAL_TABLET | Freq: Every day | ORAL | 1 refills | Status: AC

## 2016-11-15 NOTE — Telephone Encounter (Signed)
Patient in office for follow up appointment today. Next Visit will be due May 2018  Labs: 07/25/16 Creat. 1.54 Alk. Phos. 20 GFR 37 PLQ Eye Exam: 10/21/15 WNL  Okay to refill PLQ?

## 2016-11-15 NOTE — Patient Instructions (Signed)
Hand Exercises Introduction Hand exercises can be helpful to almost anyone. These exercises can strengthen the hands, improve flexibility and movement, and increase blood flow to the hands. These results can make work and daily tasks easier. Hand exercises can be especially helpful for people who have joint pain from arthritis or have nerve damage from overuse (carpal tunnel syndrome). These exercises can also help people who have injured a hand. Most of these hand exercises are fairly gentle stretching routines. You can do them often throughout the day. Still, it is a good idea to ask your health care provider which exercises would be best for you. Warming your hands before exercise may help to reduce stiffness. You can do this with gentle massage or by placing your hands in warm water for 15 minutes. Also, make sure you pay attention to your level of hand pain as you begin an exercise routine. Exercises Knuckle Bend  Repeat this exercise 5-10 times with each hand. 1. Stand or sit with your arm, hand, and all five fingers pointed straight up. Make sure your wrist is straight. 2. Gently and slowly bend your fingers down and inward until the tips of your fingers are touching the tops of your palm. 3. Hold this position for a few seconds. 4. Extend your fingers out to their original position, all pointing straight up again. Finger Fan  Repeat this exercise 5-10 times with each hand. 1. Hold your arm and hand out in front of you. Keep your wrist straight. 2. Squeeze your hand into a fist. 3. Hold this position for a few seconds. 4. Fan out, or spread apart, your hand and fingers as much as possible, stretching every joint fully. Tabletop  Repeat this exercise 5-10 times with each hand. 1. Stand or sit with your arm, hand, and all five fingers pointed straight up. Make sure your wrist is straight. 2. Gently and slowly bend your fingers at the knuckles where they meet the hand until your hand is  making an upside-down L shape. Your fingers should form a tabletop. 3. Hold this position for a few seconds. 4. Extend your fingers out to their original position, all pointing straight up again. Making Os  Repeat this exercise 5-10 times with each hand. 1. Stand or sit with your arm, hand, and all five fingers pointed straight up. Make sure your wrist is straight. 2. Make an O shape by touching your pointer finger to your thumb. Hold for a few seconds. Then open your hand wide. 3. Repeat this motion with each finger on your hand. Table Spread  Repeat this exercise 5-10 times with each hand. 1. Place your hand on a table with your palm facing down. Make sure your wrist is straight. 2. Spread your fingers out as much as possible. Hold this position for a few seconds. 3. Slide your fingers back together again. Hold for a few seconds. Ball Grip  Repeat this exercise 10-15 times with each hand. 1. Hold a tennis ball or another soft ball in your hand. 2. While slowly increasing pressure, squeeze the ball as hard as possible. 3. Squeeze as hard as you can for 3-5 seconds. 4. Relax and repeat. Wrist Curls  Repeat this exercise 10-15 times with each hand. 1. Sit in a chair that has armrests. 2. Hold a light weight in your hand, such as a dumbbell that weighs 1-3 pounds (0.5-1.4 kg). Ask your health care provider what weight would be best for you. 3. Rest your hand just   over the end of the chair arm with your palm facing up. 4. Gently pivot your wrist up and down while holding the weight. Do not twist your wrist from side to side. Contact a health care provider if:  Your hand pain or discomfort gets much worse when you do an exercise.  Your hand pain or discomfort does not improve within 2 hours after you exercise. If you have any of these problems, stop doing these exercises right away. Do not do them again unless your health care provider says that you can. Get help right away if:  You  develop sudden, severe hand pain. If this happens, stop doing these exercises right away. Do not do them again unless your health care provider says that you can. This information is not intended to replace advice given to you by your health care provider. Make sure you discuss any questions you have with your health care provider. Document Released: 11/01/2015 Document Revised: 04/27/2016 Document Reviewed: 05/31/2015  2017 Elsevier  

## 2016-11-15 NOTE — Telephone Encounter (Signed)
I already refilled his prescription in office today.Note patient is off of methotrexate because it affected her kidneys.She is doing well with Plaquenil 200 mg daily. NewPlan: Stay on Plaquenil for now.She does have pain to her hands which is consistent with osteoarthritis. If with Aspercreme

## 2017-03-15 DIAGNOSIS — M19041 Primary osteoarthritis, right hand: Secondary | ICD-10-CM | POA: Insufficient documentation

## 2017-03-15 DIAGNOSIS — M79641 Pain in right hand: Secondary | ICD-10-CM | POA: Insufficient documentation

## 2017-03-15 DIAGNOSIS — M19042 Primary osteoarthritis, left hand: Secondary | ICD-10-CM

## 2017-03-15 DIAGNOSIS — Z79899 Other long term (current) drug therapy: Secondary | ICD-10-CM | POA: Insufficient documentation

## 2017-03-15 DIAGNOSIS — M810 Age-related osteoporosis without current pathological fracture: Secondary | ICD-10-CM | POA: Insufficient documentation

## 2017-03-15 NOTE — Progress Notes (Signed)
Office Visit Note  Patient: Melinda Sparks             Date of Birth: 01-19-1956           MRN: [id number]             PCP: Phineas Inches, MD Referring: Bernerd Limbo, MD Visit Date: 03/20/2017 Occupation: _0 @    Subjective:  Follow-up   History of Present Illness: Melinda Sparks is a 61 y.o. female   Last seen 11/15/2016 History of rheumatoid arthritis and has done well with Plaquenil 200 mg once a day. We had to take her off of methotrexate (4 pills per week) around August 2017 when her kidney functions became abnormal. Note that patient is also on seizure medications( which affect her liver) and we did not want her medication to add extra burden to the liver function.  Today, patient is complaining of pain to the right first MCP. She is also complaining of pain to the right knee. She has not missed any doses of Plaquenil.  Patient is accompanied by her husband who has to drive her to all of her appointments. He scheduled to get rotator cuff tear repair by Dr. Sharol Given next week.  Patient's last Plaquenil eye exam was November 2018.   Activities of Daily Living:  Patient reports morning stiffness for 30 minutes.   Patient Reports nocturnal pain.  Difficulty dressing/grooming: Denies Difficulty climbing stairs: Denies Difficulty getting out of chair: Denies Difficulty using hands for taps, buttons, cutlery, and/or writing: Reports   No Rheumatology ROS completed.   PMFS History:  Patient Active Problem List   Diagnosis Date Noted  . High risk medication use 03/15/2017  . Primary osteoarthritis of both hands 03/15/2017  . Pain in right hand 03/15/2017  . Age-related osteoporosis without current pathological fracture 03/15/2017  . Rheumatoid arthritis involving multiple sites with positive rheumatoid factor ([place]) 11/13/2016    Past Medical History:  Diagnosis Date  . Rheumatoid arthritis, adult ([place])   . 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   .  Thyroid disease     Family History  Problem Relation Age of Onset  . Colon cancer Father 72   Past Surgical History:  Procedure Laterality Date  . APPENDECTOMY     Social History   Social History Narrative  . No narrative on file     Objective: Vital Signs: BP 124/73   Pulse 78   Resp 13   Ht _1  (1.626 m)   Wt 106 lb (48.1 kg)   BMI 18.19 kg/m    Physical Exam   Musculoskeletal Exam:  Full range of motion of all joints Grip strength is equal and strong bilaterally Fibromyalgia tender points are all absent  CDAI Exam: CDAI Homunculus Exam:   Tenderness:  Right hand: 1st MCP and 2nd MCP Left hand: 1st MCP  Swelling:  Right hand: 1st MCP and 2nd MCP Left hand: 1st MCP  Joint Counts:  CDAI Tender Joint count: 3 CDAI Swollen Joint count: 3  Global Assessments:  Patient Global Assessment: 3 Provider Global Assessment: 3  CDAI Calculated Score: 12    Investigation: Findings:  10/21/2015 normal PLQ eye exam 04/20/16 RAPID3 shows a raw score of 2.8 with an index of 1, which was consistent with near remission.  10/21/2015  X-rays of the bilateral hands, 2 views, versus April 2015 show bilateral intercarpal, radiocarpal, and ulnar carpal joint space narrowing.  No ulnar styloid erosion.  There was bilateral  DIP and PIP joint space narrowing.  No change versus April 2015.   X-rays of the bilateral feet, 2 views, versus April 2015 show bilateral intratarsal joint space narrowing, bilateral first MTP narrowing, and bilateral PIP and DIP narrowing.  No erosions.  No heel spurs.  No change versus April 2015.  11/01/2011 G6PD normal/ hepatitis acute screening panel negative   Patient went to Dr. Zenia Resides office November 2017 to get the Plaquenil eye exam and it was normal. They will have the documentation sent to our office   Labs in November are normal that include CBC with differential CMP with GFR  STOPPED MTX due to Incr kidney function appr aug  2017  02/15/2017. Patient had labs done at [facility], Dr. Delaney Meigs. On 02/15/2017. CBC with differential shows white count of 4.4 RBCs of 4.6 Hemoglobin at 14.1 Platelet at 291.  CMP with GFR shows from 02-15-17 wnl GFR slightly low at 57 Creatinine at 1.0     Lab on 10/12/2016  Component Date Value Ref Range Status  . WBC 10/12/2016 5.8  3.8 - 10.8 K/uL Final  . RBC 10/12/2016 4.36  3.80 - 5.10 MIL/uL Final  . Hemoglobin 10/12/2016 13.2  11.7 - 15.5 g/dL Final  . HCT 10/12/2016 40.4  35.0 - 45.0 % Final  . MCV 10/12/2016 92.7  80.0 - 100.0 fL Final  . MCH 10/12/2016 30.3  27.0 - 33.0 pg Final  . MCHC 10/12/2016 32.7  32.0 - 36.0 g/dL Final  . RDW 10/12/2016 13.0  11.0 - 15.0 % Final  . Platelets 10/12/2016 305  140 - 400 K/uL Final  . MPV 10/12/2016 8.9  7.5 - 12.5 fL Final  . Neutro Abs 10/12/2016 3538  1,500 - 7,800 cells/uL Final  . Lymphs Abs 10/12/2016 1276  850 - 3,900 cells/uL Final  . Monocytes Absolute 10/12/2016 754  200 - 950 cells/uL Final  . Eosinophils Absolute 10/12/2016 174  15 - 500 cells/uL Final  . Basophils Absolute 10/12/2016 58  0 - 200 cells/uL Final  . Neutrophils Relative % 10/12/2016 61  % Final  . Lymphocytes Relative 10/12/2016 22  % Final  . Monocytes Relative 10/12/2016 13  % Final  . Eosinophils Relative 10/12/2016 3  % Final  . Basophils Relative 10/12/2016 1  % Final  . Smear Review 10/12/2016 Criteria for review not met   Final  . Sodium 10/12/2016 139  135 - 146 mmol/L Final  . Potassium 10/12/2016 4.3  3.5 - 5.3 mmol/L Final  . Chloride 10/12/2016 100  98 - 110 mmol/L Final  . CO2 10/12/2016 25  20 - 31 mmol/L Final  . Glucose, Bld 10/12/2016 88  65 - 99 mg/dL Final  . BUN 10/12/2016 16  7 - 25 mg/dL Final  . Creat 10/12/2016 1.03  0.50 - 1.05 mg/dL Final   Comment:   For patients > or = 59 years of age: The upper reference limit for Creatinine is approximately 13% higher for people identified as African-American.     . Total Bilirubin  10/12/2016 0.4  0.2 - 1.2 mg/dL Final  . Alkaline Phosphatase 10/12/2016 27* 33 - 130 U/L Final  . AST 10/12/2016 24  10 - 35 U/L Final  . ALT 10/12/2016 20  6 - 29 U/L Final  . Total Protein 10/12/2016 7.3  6.1 - 8.1 g/dL Final  . Albumin 10/12/2016 4.4  3.6 - 5.1 g/dL Final  . Calcium 10/12/2016 9.7  8.6 - 10.4 mg/dL Final  . GFR, Est  African American 10/12/2016 69  >=60 mL/min Final  . GFR, Est Non African American 10/12/2016 60  >=60 mL/min Final      Imaging: [place] Extrem Up Bilat Comp  Result Date: 03/20/2017 Ultrasound examination of bilateral hands was performed per EULAR recommendations. Using 12 MHz transducer, grayscale and power Doppler bilateral second, third, and fifth MCP joints and bilateral wrist joints both dorsal and volar aspects were evaluated to look for synovitis or tenosynovitis. The findings were there was no synovitis or tenosynovitis on ultrasound examination area and she has synovial thickening in bilateral second MCP joints.. Right median nerve was 0.08 cm squares which was within normal limits and left median nerve was 0.09 cm squares which was normal limits. Impression: Ultrasound examination of bilateral hands showed synovial thickening but no active synovitis. Bilateral median nerves are within normal limits.   Speciality Comments: No specialty comments available.    Procedures:  No procedures performed Allergies: Felbamate; Tiagabine; and Zonisamide   Assessment / Plan:     Visit Diagnoses: Rheumatoid arthritis involving multiple sites with positive rheumatoid factor ([place]) - Plan: [place] Extrem Up Bilat Comp  High risk medication use - PLQ -231m qdSTOPPED mtx due to Incr kidney function appr aug 2017;03/20/2017: Plaquenil eye exam normal November 2017 to Dr. GBonney Roussel  Primary osteoarthritis of both hands  Pain in right hand  Age-related osteoporosis without current pathological fracture  Bilateral hand pain - Plan: [place] Up Bilat Comp    Plan: #1: Rheumatoid arthritis. Rheumatoid factor positive. Patient is complaining of pain to right first and second MCP joint and left first MCP joint. She is on Plaquenil 200 mg once a day. As a result of this complaint and some synovial thickening noted on left first and right first second and third, we elected to do an ultrasound of bilateral hands and wrist today. Please see report written by Dr. DEstanislado Pandyfor this. In summary: A she did not have any synovitis to the affected joints described above. She had some synovial thickening. As a result, her pain is probably coming from osteoarthritis. Being that that's the case, we have decided to continue the patient on Plaquenil 20 mg once a day.  #2: High-risk prescription. Plaquenil 200 mg once a day. Plaquenil eye exam was normal November 2017 at Dr. GVelvet Batheoffice and patient is required to do it annually and will be due in November 2018.  #3: Right knee pain. There is some mild Baker's cyst noted on the right posterior knee. We elected not to do any cortisone injection at this time. I suspect that it'll improve on its own but if it doesn't I'll be happy to inject her at the appropriate time. She can use Voltaren gel to the posterior aspect of her knee if needed.  #4: Patient's labs have been done on March 2018 at Dr. BDelaney Meigscomes office.02/15/2017. [facility] B[facility] On 02/15/2017. CBC with differential shows white count of 4.4 RBCs of 4.6 Hemoglobin at 14.1 Platelet at 291.  CMP with GFR shows from 02-15-17 wnl GFR slightly low at 57 Creatinine at 1.0  #5: Return to clinic in 4 months. At that time she'll be due for repeat blood work which will be CBC with differential CMP with GFR.    Orders: Orders Placed This Encounter  Procedures  . [place] Up Bilat Comp   No orders of the defined types were placed in this encounter.   Face-to-face time spent with patient was 30  minutes. 50% of time  was spent in counseling and coordination of care.  Follow-Up Instructions: Return in about 4 months (around 07/20/2017) for RA,. On my exam today she has synovial thickening on examination but no active synovitis was noted. Ultrasound was also obtained in the findings are listed above. At this point we will not change her treatment.  Bo Merino, MD  Note - This record has been created using Editor, commissioning.  Chart creation errors have been sought, but may not always  have been located. Such creation errors do not reflect on  the standard of medical care.

## 2017-03-20 ENCOUNTER — Inpatient Hospital Stay (INDEPENDENT_AMBULATORY_CARE_PROVIDER_SITE_OTHER): Payer: Medicare HMO

## 2017-03-20 ENCOUNTER — Ambulatory Visit (INDEPENDENT_AMBULATORY_CARE_PROVIDER_SITE_OTHER): Payer: Medicare HMO | Admitting: Rheumatology

## 2017-03-20 ENCOUNTER — Encounter: Payer: Self-pay | Admitting: Rheumatology

## 2017-03-20 VITALS — BP 124/73 | HR 78 | Resp 13 | Ht 64.0 in | Wt 106.0 lb

## 2017-03-20 DIAGNOSIS — M79642 Pain in left hand: Secondary | ICD-10-CM | POA: Diagnosis not present

## 2017-03-20 DIAGNOSIS — M810 Age-related osteoporosis without current pathological fracture: Secondary | ICD-10-CM | POA: Diagnosis not present

## 2017-03-20 DIAGNOSIS — Z79899 Other long term (current) drug therapy: Secondary | ICD-10-CM

## 2017-03-20 DIAGNOSIS — M79641 Pain in right hand: Secondary | ICD-10-CM

## 2017-03-20 DIAGNOSIS — M19041 Primary osteoarthritis, right hand: Secondary | ICD-10-CM

## 2017-03-20 DIAGNOSIS — M19042 Primary osteoarthritis, left hand: Secondary | ICD-10-CM | POA: Diagnosis not present

## 2017-03-20 DIAGNOSIS — M0579 Rheumatoid arthritis with rheumatoid factor of multiple sites without organ or systems involvement: Secondary | ICD-10-CM

## 2017-04-09 ENCOUNTER — Other Ambulatory Visit: Payer: Self-pay | Admitting: Family Medicine

## 2017-04-09 DIAGNOSIS — Z1231 Encounter for screening mammogram for malignant neoplasm of breast: Secondary | ICD-10-CM

## 2017-04-09 DIAGNOSIS — M8000XA Age-related osteoporosis with current pathological fracture, unspecified site, initial encounter for fracture: Secondary | ICD-10-CM

## 2017-04-16 ENCOUNTER — Emergency Department: Payer: Medicare HMO

## 2017-04-16 DIAGNOSIS — Y929 Unspecified place or not applicable: Secondary | ICD-10-CM | POA: Insufficient documentation

## 2017-04-16 DIAGNOSIS — S62336A Displaced fracture of neck of fifth metacarpal bone, right hand, initial encounter for closed fracture: Secondary | ICD-10-CM | POA: Diagnosis not present

## 2017-04-16 DIAGNOSIS — Y939 Activity, unspecified: Secondary | ICD-10-CM | POA: Diagnosis not present

## 2017-04-16 DIAGNOSIS — S5001XA Contusion of right elbow, initial encounter: Secondary | ICD-10-CM | POA: Diagnosis not present

## 2017-04-16 DIAGNOSIS — R569 Unspecified convulsions: Secondary | ICD-10-CM | POA: Insufficient documentation

## 2017-04-16 DIAGNOSIS — W1839XA Other fall on same level, initial encounter: Secondary | ICD-10-CM | POA: Diagnosis not present

## 2017-04-16 DIAGNOSIS — S20211A Contusion of right front wall of thorax, initial encounter: Secondary | ICD-10-CM | POA: Insufficient documentation

## 2017-04-16 DIAGNOSIS — Y999 Unspecified external cause status: Secondary | ICD-10-CM | POA: Insufficient documentation

## 2017-04-16 DIAGNOSIS — S6991XA Unspecified injury of right wrist, hand and finger(s), initial encounter: Secondary | ICD-10-CM | POA: Diagnosis present

## 2017-04-16 NOTE — ED Triage Notes (Addendum)
Patient ambulatory to triage with steady gait, without difficulty or distress noted; pt reports that she had a seizure and fell onto water pipe injuring right lateral ribcage; bruising noted to right elbow and right hand but denies pain; denies any other c/o or injuries; st taking seizure meds as rx and does have frequent seizures; pt & husband st they will check with her neurologist regarding meds, are not concerned about her dosing just if she has broken anything

## 2017-04-17 ENCOUNTER — Emergency Department: Payer: Medicare HMO

## 2017-04-17 ENCOUNTER — Emergency Department
Admission: EM | Admit: 2017-04-17 | Discharge: 2017-04-17 | Disposition: A | Discharge status: Home / Self Care | Payer: Medicare HMO | Attending: Emergency Medicine | Admitting: Emergency Medicine

## 2017-04-17 DIAGNOSIS — R569 Unspecified convulsions: Secondary | ICD-10-CM

## 2017-04-17 DIAGNOSIS — S20211A Contusion of right front wall of thorax, initial encounter: Secondary | ICD-10-CM

## 2017-04-17 DIAGNOSIS — S62339A Displaced fracture of neck of unspecified metacarpal bone, initial encounter for closed fracture: Secondary | ICD-10-CM

## 2017-04-17 DIAGNOSIS — S5001XA Contusion of right elbow, initial encounter: Secondary | ICD-10-CM

## 2017-04-17 DIAGNOSIS — S60221A Contusion of right hand, initial encounter: Secondary | ICD-10-CM

## 2017-04-17 MED ORDER — HYDROCODONE-ACETAMINOPHEN 5-325 MG PO TABS: 1.0000 | ORAL_TABLET | Freq: Four times a day (QID) | ORAL | 0 refills | Status: DC | PRN

## 2017-04-17 MED ORDER — HYDROCODONE-ACETAMINOPHEN 5-325 MG PO TABS
0.5000 | ORAL_TABLET | Freq: Once | ORAL | Status: AC
  Administered 2017-04-17: 0.5 via ORAL
  Filled 2017-04-17: qty 1

## 2017-04-17 MED ORDER — IBUPROFEN 600 MG PO TABS: 600.0000 mg | ORAL_TABLET | Freq: Three times a day (TID) | ORAL | 0 refills | Status: DC | PRN

## 2017-04-17 NOTE — Discharge Instructions (Signed)
1. Keep splint clean and dry. Elevate affected area and apply ice over splint several times daily to reduce swelling. 2. You may take pain medicines as needed (Motrin/Norco). 3. Return to the ER for worsening symptoms, persistent vomiting, difficulty breathing or other concerns.

## 2017-04-17 NOTE — ED Provider Notes (Signed)
[facility] Emergency Department Provider Note   ____________________________________________   First MD Initiated Contact with Patient 04/17/17 0309     (approximate)  I have reviewed the triage vital signs and the nursing notes.   HISTORY  Chief Complaint Fall    HPI Melinda Sparks is a 61 y.o. female who presents to the ED from home with a chief complaint of rib and hand pain. Patient has a history of epilepsy with frequent seizures. Reports she had a seizure last evening and fell onto a water pipe injuring her right ribs and hand. She is not concerned about her seizure nor dosing of her seizure medications; she is here for an x-ray to see if she has broken a rib. Denies recent fever, chills, shortness of breath, abdominal pain, nausea, vomiting. Nothing makes her pain better. Movement makes her pain worse.   Past Medical History:  Diagnosis Date  . Rheumatoid arthritis, adult ([place])   . Seizures ([place])    epilepsy since teenager.   . Thyroid disease     Patient Active Problem List   Diagnosis Date Noted  . High risk medication use 03/15/2017  . Primary osteoarthritis of both hands 03/15/2017  . Pain in right hand 03/15/2017  . Age-related osteoporosis without current pathological fracture 03/15/2017  . Rheumatoid arthritis involving multiple sites with positive rheumatoid factor ([place]) 11/13/2016    Past Surgical History:  Procedure Laterality Date  . APPENDECTOMY      Prior to Admission medications   Medication Sig Start Date End Date Taking? Authorizing Provider  alendronate (FOSAMAX) 10 MG tablet Take 10 mg by mouth daily before breakfast. Take with a full glass of water on an empty stomach.    [provider]  ascorbic acid (VITAMIN C) 1000 MG tablet Take 1,000 mg by mouth daily.    [provider]  BIOTIN PO Take 3,000 mcg by mouth daily.    [provider]  Cholecalciferol (VITAMIN D) 2000 UNITS tablet  Take 2,000 Units by mouth every evening.    [provider]  diclofenac sodium (VOLTAREN) 1 % GEL Voltaren Gel 3 grams to 3 large joints upto TID 3 TUBES with 3 refills Patient not taking: Reported on 03/20/2017 11/15/16   Panwala, Naitik, PA-C  fluticasone (FLONASE) 50 MCG/ACT nasal spray Place 2 sprays into the nose at bedtime.    [provider]  folic acid (FOLVITE) 244 MCG tablet Take 400-1,600 mcg by mouth See admin instructions. Takes 2 tablets in the morning, and 1 tablet with lunch.    [provider]  hydroxychloroquine (PLAQUENIL) 200 MG tablet Take 1 tablet (200 mg total) by mouth daily. 11/15/16 05/14/17  Panwala, Naitik, PA-C  lamoTRIgine (LAMICTAL) 100 MG tablet Take 200 mg by mouth 2 (two) times daily.    [provider]  levothyroxine (SYNTHROID, LEVOTHROID) 75 MCG tablet Take 75 mcg by mouth at bedtime.    [provider]  Multiple Vitamins-Minerals (CENTRUM SILVER ADULT 50+) TABS Take 1 tablet by mouth every evening.    [provider]  Multiple Vitamins-Minerals (MULTI FOR HER PO) Take 1 tablet by mouth daily at 12 noon. One-a-day Womens'    [provider]  OXcarbazepine (TRILEPTAL) 150 MG tablet  03/07/17   [provider]  psyllium (METAMUCIL) 58.6 % packet Take 1 packet by mouth every evening.    [provider]  simvastatin (ZOCOR) 20 MG tablet Take 20 mg by mouth daily at 12 noon.  [provider]  SOYBEAN PO Take by mouth.    [provider]    Allergies Felbamate; Tiagabine; and Zonisamide  Family History  Problem Relation Age of Onset  . Colon cancer Father 28    Social History Social History  Substance Use Topics  . Smoking status: Never Smoker  . Smokeless tobacco: Never Used  . Alcohol use No    Review of Systems  Constitutional: No fever/chills. Eyes: No visual changes. ENT: No sore throat. Cardiovascular: Denies chest pain. Respiratory: Positive for  right rib pain. Denies shortness of breath. Gastrointestinal: No abdominal pain.  No nausea, no vomiting.  No diarrhea.  No constipation. Genitourinary: Positive for right hand pain. Negative for dysuria. Musculoskeletal: Negative for back pain. Skin: Negative for rash. Neurological: Negative for headaches, focal weakness or numbness.   ____________________________________________   PHYSICAL EXAM:  VITAL SIGNS: ED Triage Vitals  Enc Vitals Group     BP 04/16/17 2246 (!) 154/85     Pulse Rate 04/16/17 2246 88     Resp 04/16/17 2246 20     Temp 04/16/17 2246 97.8 F (36.6 C)     Temp Source 04/16/17 2246 Oral     SpO2 04/16/17 2246 99 %     Weight 04/16/17 2245 110 lb (49.9 kg)     Height 04/16/17 2245 5\' 7"  (1.702 m)     Head Circumference --      Peak Flow --      Pain Score 04/16/17 2300 7     Pain Loc --      Pain Edu? --      Excl. in [place]? --     Constitutional: Alert and oriented. Well appearing and in no acute distress. Eyes: Conjunctivae are normal. PERRL. EOMI. Head: Atraumatic. Nose: No congestion/rhinnorhea. Mouth/Throat: Mucous membranes are moist.  Oropharynx non-erythematous. Neck: No stridor.  No cervical spine tenderness to palpation. Cardiovascular: Normal rate, regular rhythm. Grossly normal heart sounds.  Good peripheral circulation. Respiratory: Normal respiratory effort.  No retractions. Lungs CTAB. Right lateral ribs tender to palpation. No crepitus. Mild splinting. Gastrointestinal: Soft and nontender. No distention. No abdominal bruits. No CVA tenderness. Musculoskeletal: Right hand bruising and swelling over the dorsal fourth and fifth metacarpals. Full range of motion with pain. Bruising near right olecranon with full range of motion without pain or crepitus. Brisk, less than 5 second capillary refill. 2+ radial pulses. No lower extremity tenderness nor edema.  No joint effusions. Neurologic:  Normal speech and language. No gross focal neurologic  deficits are appreciated. No gait instability. Skin:  Skin is warm, dry and intact. No rash noted. Psychiatric: Mood and affect are normal. Speech and behavior are normal.  ____________________________________________   LABS (all labs ordered are listed, but only abnormal results are displayed)  Labs Reviewed - No data to display ____________________________________________  EKG  None ____________________________________________  RADIOLOGY  Chest 2 view interpreted per Dr. Jeannine Boga: No active cardiopulmonary disease.  Right hand x-rays (viewed by me, interpreted per Dr. Randel Pigg): 1. Acute, comminuted fracture of the distal fifth metacarpal with  volar and radial angulation of the main distal fracture fragment.  2. Osteoarthritis of the interphalangeal joint of the thumb and  fifth DIP joint.   ____________________________________________   PROCEDURES  Procedure(s) performed:   SPLINT APPLICATION Date/Time: 1:51 AM Authorized by: Paulette Blanch Consent: Verbal consent obtained. Risks and benefits: risks, benefits and alternatives were discussed Consent given by: patient Splint applied by: ED technician Location details: Right hand Splint  type: Boxer's Supplies used: OCL Post-procedure: The splinted body part was neurovascularly unchanged following the procedure. Patient tolerance: Patient tolerated the procedure well with no immediate complications.    Procedures  Critical Care performed: No  ____________________________________________   INITIAL IMPRESSION / ASSESSMENT AND PLAN / ED COURSE  Pertinent labs & imaging results that were available during my care of the patient were reviewed by me and considered in my medical decision making (see chart for details).  61 year old female with seizure disorder who presents with right rib and right hand pain status post seizure. Denies striking head or head injury. Chest x-ray negative for rib fracture or pneumothorax.  Will obtain right hand x-ray.  Clinical Course as of Apr 17 449  Tue Apr 17, 2017  0446 Pain improved with Norco. Updated patient and her mother of x-ray results. Will place a splint and refer to hand surgeon. Strict return precautions given. Both verbalize understanding and agree with plan of care.  [JS]    Clinical Course User Index [JS] Paulette Blanch, MD     ____________________________________________   FINAL CLINICAL IMPRESSION(S) / ED DIAGNOSES  Final diagnoses:  Contusion of rib on right side, initial encounter  Contusion of right hand, initial encounter  Contusion of right elbow, initial encounter  Seizure ([place])  Closed boxer's fracture, initial encounter      NEW MEDICATIONS STARTED DURING THIS VISIT:  New Prescriptions   No medications on file     Note:  This document was prepared using Dragon voice recognition software and may include unintentional dictation errors.    Paulette Blanch, MD 04/17/17 [phone]

## 2017-04-24 ENCOUNTER — Ambulatory Visit (INDEPENDENT_AMBULATORY_CARE_PROVIDER_SITE_OTHER): Payer: Medicare HMO

## 2017-04-24 ENCOUNTER — Encounter (INDEPENDENT_AMBULATORY_CARE_PROVIDER_SITE_OTHER): Payer: Self-pay | Admitting: Orthopaedic Surgery

## 2017-04-24 ENCOUNTER — Ambulatory Visit (INDEPENDENT_AMBULATORY_CARE_PROVIDER_SITE_OTHER): Payer: Medicare HMO | Admitting: Orthopaedic Surgery

## 2017-04-24 VITALS — BP 110/63 | HR 82 | Ht 64.0 in | Wt 102.0 lb

## 2017-04-24 DIAGNOSIS — S62336A Displaced fracture of neck of fifth metacarpal bone, right hand, initial encounter for closed fracture: Secondary | ICD-10-CM | POA: Diagnosis not present

## 2017-04-24 NOTE — Progress Notes (Signed)
Office Visit Note   Patient: Melinda Sparks           Date of Birth: 03/05/1956           MRN: [id number] Visit Date: 04/24/2017              Requested by: Bernerd Limbo, MD [place], [place] [address] PCP: Bernerd Limbo, MD   Assessment & Plan: Visit Diagnoses:  1. Closed displaced fracture of neck of fifth metacarpal bone of right hand, initial encounter     Plan: Closed reduction and splint application.Patient has hydrocodone and oxycodone and Advil at home  Follow-Up Instructions: No Follow-up on file.   Orders:  Orders Placed This Encounter  Procedures  . XR Hand Complete Right   No orders of the defined types were placed in this encounter.     Procedures: No procedures performed Right hand was prepped with alcohol and Betadine. I infiltrated the boxer's fracture with a combination 1% Xylocaine without epinephrine and 0.25% Marcaine without epinephrine. When the patient was comfortable manipulation was performed. Ulnar gutter splint applied.Initial films reveal persistent displacement. We took Melinda Sparks to the x-ray room,placed her in finger traps and then remanipulated. Repeat films reveal acceptable position. The fracture is inherently unstable as it is a short oblique. We will plan to see her back in a week and repeat the films  Clinical Data: No additional findings.   Subjective: Chief Complaint  Patient presents with  . Right Hand - Fracture, Pain    Melinda Sparks is a 61 y.o. female who presents from home with rib and hand pain. Patient has a history of epilepsy with frequent seizures. Reports she had a seizure Monday evening and fell onto a water pipe injuring her rib and hand. (5th metatarsal)  Injury occurred just over a week ago and was initially seen and [facility] and placed in a splint to the right hand. Apparently she had an appointment to see one of the orthopedists but that visits the office here instead. I  reviewed her films on the PACS system. She has a displaced boxer's fracture to the right little metacarpal. She also is been complaining of some right rib pain. I reviewed the films of her ribs on the PACS system and there is no evidence of a fracture  HPI  Review of Systems   Objective: Vital Signs: BP 110/63   Pulse 82   Ht 5\' 4"  (1.626 m)   Wt 102 lb (46.3 kg)   BMI 17.51 kg/m   Physical Exam  Ortho Exam splint right hand was removed with is mild edema along the little finger metacarpal phalangeal joint. Appears to be flexion at the fracture site. Skin intact. Neurovascular exam intact. Some rheumatoid changes in consistent with a history of rheumatoid arthritis.  Specialty Comments:  No specialty comments available.  Imaging: No results found.   PMFS History: Patient Active Problem List   Diagnosis Date Noted  . High risk medication use 03/15/2017  . Primary osteoarthritis of both hands 03/15/2017  . Pain in right hand 03/15/2017  . Age-related osteoporosis without current pathological fracture 03/15/2017  . Rheumatoid arthritis involving multiple sites with positive rheumatoid factor ([place]) 11/13/2016   Past Medical History:  Diagnosis Date  . Rheumatoid arthritis, adult ([place])   . Seizures ([place])    epilepsy since teenager.   . Thyroid disease     Family History  Problem Relation Age of Onset  . Colon  cancer Father 37    Past Surgical History:  Procedure Laterality Date  . APPENDECTOMY     Social History   Occupational History  . Not on file.   Social History Main Topics  . Smoking status: Never Smoker  . Smokeless tobacco: Never Used  . Alcohol use No  . Drug use: No  . Sexual activity: Not on file     Garald Balding, MD   Note - This record has been created using [employer].  Chart creation errors have been sought, but may not always  have been located. Such creation errors do not reflect on  the standard of medical care.

## 2017-04-25 ENCOUNTER — Ambulatory Visit
Admission: RE | Admit: 2017-04-25 | Discharge: 2017-04-25 | Disposition: A | Discharge status: Home / Self Care | Payer: Medicare HMO | Source: Ambulatory Visit | Attending: Family Medicine | Admitting: Family Medicine

## 2017-04-25 DIAGNOSIS — Z1231 Encounter for screening mammogram for malignant neoplasm of breast: Secondary | ICD-10-CM

## 2017-04-25 DIAGNOSIS — M8000XA Age-related osteoporosis with current pathological fracture, unspecified site, initial encounter for fracture: Secondary | ICD-10-CM

## 2017-04-26 ENCOUNTER — Telehealth (INDEPENDENT_AMBULATORY_CARE_PROVIDER_SITE_OTHER): Payer: Self-pay | Admitting: Orthopaedic Surgery

## 2017-04-26 ENCOUNTER — Ambulatory Visit (INDEPENDENT_AMBULATORY_CARE_PROVIDER_SITE_OTHER): Payer: Medicare HMO | Admitting: Orthopaedic Surgery

## 2017-04-26 ENCOUNTER — Encounter (INDEPENDENT_AMBULATORY_CARE_PROVIDER_SITE_OTHER): Payer: Self-pay | Admitting: Orthopaedic Surgery

## 2017-04-26 ENCOUNTER — Ambulatory Visit (INDEPENDENT_AMBULATORY_CARE_PROVIDER_SITE_OTHER): Payer: Medicare HMO

## 2017-04-26 VITALS — BP 120/78 | HR 80 | Resp 14 | Ht 65.0 in | Wt 102.0 lb

## 2017-04-26 DIAGNOSIS — S62336A Displaced fracture of neck of fifth metacarpal bone, right hand, initial encounter for closed fracture: Secondary | ICD-10-CM

## 2017-04-26 DIAGNOSIS — M79641 Pain in right hand: Secondary | ICD-10-CM

## 2017-04-26 NOTE — Telephone Encounter (Signed)
Patient called this morning stating that the cast Dr. Durward Fortes placed on her finger has since come off.  CB#[phone].

## 2017-04-26 NOTE — H&P (Addendum)
Office Visit Note   Patient: Melinda Sparks           Date of Birth: [birth year]           MRN: [id number] Visit Date: 04/26/2017              Requested by: Bernerd Limbo, MD [place], [place] [address] PCP: Bernerd Limbo, MD   Assessment & Plan: Visit Diagnoses:  1. Closed displaced fracture of neck of fifth metacarpal bone of right hand, initial encounter   2. Pain in right hand     Plan:  #1: Closed reduction and percutaneous pinning right distal fifth metacarpal fracture, possible ORIF next Tuesday at [facility] as an outpatient.  Follow-Up Instructions: No Follow-up on file.   Orders:  Orders Placed This Encounter  Procedures  . XR Hand Complete Right   No orders of the defined types were placed in this encounter.     Procedures: No procedures performed   Clinical Data: No additional findings.   Subjective: Chief Complaint  Patient presents with  . Right Hand - Follow-up    Ms. Quade is a 72 y o that is here for a re-wrap of her Right hand. SHe continues to cook and do dishes using the hand a lot    Mrs. Heck is a 61 y.o. female who presents today with right hand pain. Patient has a history of epilepsy with frequent seizures. Reports she had a seizure Monday evening and fell onto a water pipe injuring her rib and hand that of the fifth metacarpal. She was initially seen and [facility] and placed in a splint to the right hand. She haD a displaced oblique boxer's fracture to the right fifth metacarpal.  A closed reduction was performed and and was placed into an ulnar gutter splint. She had a repeat film of her hand today and is noted to have loss of the reduction. Seen today for further evaluation.     Review of Systems  Neurological: Positive for seizures.  All other systems reviewed and are negative.    Objective: Vital Signs: BP 120/78   Pulse 80   Resp 14   Ht 5\' 5"  (1.651 m)   Wt 102 lb (46.3 kg)    BMI 16.97 kg/m   Physical Exam  Constitutional: She is oriented to person, place, and time. She appears well-developed and well-nourished.  HENT:  Head: Normocephalic and atraumatic.  Eyes: EOM are normal. Pupils are equal, round, and reactive to light.  Cardiovascular: Normal rate, regular rhythm and intact distal pulses.   Pulmonary/Chest: Effort normal.  Abdominal: Soft. Bowel sounds are normal.  Neurological: She is alert and oriented to person, place, and time.  Skin: Skin is warm and dry.  Psychiatric: She has a normal mood and affect. Her behavior is normal. Judgment and thought content normal.    Ortho Exam  Today the splint is in tact. Ace wrap was rewrapped with a new Ace wrap. She had somehow lost Ace wrap while cooking last night.  Specialty Comments:  No specialty comments available.  Imaging: Dg Bone Density (dxa)  Result Date: 04/25/2017 EXAM: DUAL X-RAY ABSORPTIOMETRY (DXA) FOR BONE MINERAL DENSITY IMPRESSION: Referring Physician:  Coletta Memos, [place] PATIENT: Name: KYLEENA, SCHEIRER Patient ID: [id number] Birth Date: Feb 12, 1956 Height: 63.5 in. Sex: Female Measured: 04/25/2017 Weight: 107.5 lbs. Indications: Caucasian, Estrogen Deficient, Lamictal, Postmenopausal, Rheumatoid Arthritis (714.0), TRILEPTAL Fractures: Wrist Treatments: Calcium (E943.0), Estrace tablet, Fosamax, Vitamin D (  E933.5) ASSESSMENT: The BMD measured at Femur Total Left is 0.613 g/cm2 with a T-score of -3.1. This patient is considered OSTEOPOROTIC according to [facility]) criteria. There has been no statistically significant change in BMD of Lumbar spine, or of the Left hip since prior exam dated 12/17/2012. Site Region Measured Date Measured Age YA BMD Significant CHANGE T-score DualFemur Total Left 04[age]25/2017    59.4         -3.1    0.613 g/cm2 AP Spine  L1-L4      5/25/2017    59.4         -1.6    0.992 g/cm2 World Health Organization [facility]) criteria for post-menopausal, Caucasian  Women: Normal       T-score at or above -1 SD Osteopenia   T-score between -1 and -2.5 SD Osteoporosis T-score at or below -2.5 SD RECOMMENDATION: [facility] recommends that FDA-approved medical therapies be considered in postmenopausal women and men age 19 or older with a: 1. Hip or vertebral (clinical or morphometric) fracture. 2. T-score of <-2.5 at the spine or hip. 3. Ten-year fracture probability by FRAX of 3% or greater for hip fracture or 20% or greater for major osteoporotic fracture. All treatment decisions require clinical judgment and consideration of individual patient factors, including patient preferences, co-morbidities, previous drug use, risk factors not captured in the FRAX model (e.g. falls, vitamin D deficiency, increased bone turnover, interval significant decline in bone density) and possible under - or over-estimation of fracture risk by FRAX. All patients should ensure an adequate intake of dietary calcium (1200 mg/d) and vitamin D (800 IU daily) unless contraindicated. FOLLOW-UP: People with diagnosed cases of osteoporosis or at high risk for fracture should have regular bone mineral density tests. For patients eligible for Medicare, routine testing is allowed once every 2 years. The testing frequency can be increased to one year for patients who have rapidly progressing disease, those who are receiving or discontinuing medical therapy to restore bone mass, or have additional risk factors. I have reviewed this report, and agree with the above findings. Mark A. Thornton Papas, M.D. [facility] Radiology Electronically Signed   By: Lavonia Dana M.D.   On: 04/25/2017 17:29   Mm Screening Breast Tomo Bilateral  Result Date: 04/26/2017 CLINICAL DATA:  Screening. EXAM: 2D DIGITAL SCREENING BILATERAL MAMMOGRAM WITH CAD AND ADJUNCT TOMO COMPARISON:  Previous exam(s). ACR Breast Density Category d: The breast tissue is extremely dense, which lowers the sensitivity of mammography.  FINDINGS: There are no findings suspicious for malignancy. Images were processed with CAD. IMPRESSION: No mammographic evidence of malignancy. A result letter of this screening mammogram will be mailed directly to the patient. RECOMMENDATION: Screening mammogram in one year. (Code:SM-B-01Y) BI-RADS CATEGORY  1: Negative. Electronically Signed   By: Everlean Alstrom M.D.   On: 04/26/2017 12:53   Xr Hand Complete Right  Result Date: 04/26/2017 Three-view x-ray of the right hand reveals slight loss of position of the distal fifth metacarpal fracture. It appears that the distal fragment is volar and shortened.    PMFS History: Patient Active Problem List   Diagnosis Date Noted  . High risk medication use 03/15/2017  . Primary osteoarthritis of both hands 03/15/2017  . Pain in right hand 03/15/2017  . Age-related osteoporosis without current pathological fracture 03/15/2017  . Rheumatoid arthritis involving multiple sites with positive rheumatoid factor ([place]) 11/13/2016   Past Medical History:  Diagnosis Date  . Rheumatoid arthritis, adult ([place])   . Seizures (  [place])    epilepsy since teenager.   . Thyroid disease     Family History  Problem Relation Age of Onset  . Breast cancer Mother   . Colon cancer Father 43    Past Surgical History:  Procedure Laterality Date  . APPENDECTOMY     Social History   Occupational History  . Not on file.   Social History Main Topics  . Smoking status: Never Smoker  . Smokeless tobacco: Never Used  . Alcohol use No  . Drug use: No  . Sexual activity: Not on file    Mike Craze. [place], [place] [phone]  05/01/2017 9:51 AM     3:16 PM

## 2017-04-26 NOTE — Telephone Encounter (Signed)
Coming in today at 1:15

## 2017-04-26 NOTE — Progress Notes (Signed)
Office Visit Note   Patient: Melinda Sparks           Date of Birth: 14-Dec-1955           MRN: [id number] Visit Date: 04/26/2017              Requested by: Bernerd Limbo, MD [place], [place] [address] PCP: Bernerd Limbo, MD   Assessment & Plan: Visit Diagnoses:  1. Closed displaced fracture of neck of fifth metacarpal bone of right hand, initial encounter   2. Pain in right hand     Plan:  #1: Closed reduction and percutaneous pinning right distal fifth metacarpal fracture, possible ORIF next Tuesday at [facility] as an outpatient.  Follow-Up Instructions: No Follow-up on file.   Orders:  Orders Placed This Encounter  Procedures  . XR Hand Complete Right   No orders of the defined types were placed in this encounter.     Procedures: No procedures performed   Clinical Data: No additional findings.   Subjective: Chief Complaint  Patient presents with  . Right Hand - Follow-up    Ms. Pentland is a 28 y o that is here for a re-wrap of her Right hand. SHe continues to cook and do dishes using the hand a lot    Mrs. Manger is a 61 y.o. female who presents today with right hand pain. Patient has a history of epilepsy with frequent seizures. Reports she had a seizure Monday evening and fell onto a water pipe injuring her rib and hand that of the fifth metacarpal. She was initially seen and [facility] and placed in a splint to the right hand. She haD a displaced oblique boxer's fracture to the right fifth metacarpal.  A closed reduction was performed and and was placed into an ulnar gutter splint. She had a repeat film of her hand today and is noted to have loss of the reduction. Seen today for further evaluation.     Review of Systems  Neurological: Positive for seizures.  All other systems reviewed and are negative.    Objective: Vital Signs: BP 120/78   Pulse 80   Resp 14   Ht 5\' 5"  (1.651 m)   Wt 102 lb (46.3 kg)    BMI 16.97 kg/m   Physical Exam  Constitutional: She is oriented to person, place, and time. She appears well-developed and well-nourished.  HENT:  Head: Normocephalic and atraumatic.  Eyes: EOM are normal. Pupils are equal, round, and reactive to light.  Cardiovascular: Normal rate, regular rhythm and intact distal pulses.   Pulmonary/Chest: Effort normal.  Abdominal: Soft. Bowel sounds are normal.  Neurological: She is alert and oriented to person, place, and time.  Skin: Skin is warm and dry.  Psychiatric: She has a normal mood and affect. Her behavior is normal. Judgment and thought content normal.    Ortho Exam  Today the splint is in tact. Ace wrap was rewrapped with a new Ace wrap. She had somehow lost Ace wrap while cooking last night.  Specialty Comments:  No specialty comments available.  Imaging: Dg Bone Density (dxa)  Result Date: 04/25/2017 EXAM: DUAL X-RAY ABSORPTIOMETRY (DXA) FOR BONE MINERAL DENSITY IMPRESSION: Referring Physician:  Coletta Memos, [place] PATIENT: Name: Melinda Sparks Patient ID: [id number] Birth Date: [birth year] Height: 63.5 in. Sex: Female Measured: 04/25/2017 Weight: 107.5 lbs. Indications: Caucasian, Estrogen Deficient, Lamictal, Postmenopausal, Rheumatoid Arthritis (714.0), TRILEPTAL Fractures: Wrist Treatments: Calcium (E943.0), Estrace tablet, Fosamax, Vitamin D (  E933.5) ASSESSMENT: The BMD measured at Femur Total Left is 0.613 g/cm2 with a T-score of -3.1. This patient is considered OSTEOPOROTIC according to [facility]) criteria. There has been no statistically significant change in BMD of Lumbar spine, or of the Left hip since prior exam dated 12/17/2012. Site Region Measured Date Measured Age YA BMD Significant CHANGE T-score DualFemur Total Left 04/25/2017    59.4         -3.1    0.613 g/cm2 AP Spine  L1-L4      5/25/2017    59.4         -1.6    0.992 g/cm2 World Health Organization [facility]) criteria for post-menopausal, Caucasian  Women: Normal       T-score at or above -1 SD Osteopenia   T-score between -1 and -2.5 SD Osteoporosis T-score at or below -2.5 SD RECOMMENDATION: [facility] recommends that FDA-approved medical therapies be considered in postmenopausal women and men age 33 or older with a: 1. Hip or vertebral (clinical or morphometric) fracture. 2. T-score of <-2.5 at the spine or hip. 3. Ten-year fracture probability by FRAX of 3% or greater for hip fracture or 20% or greater for major osteoporotic fracture. All treatment decisions require clinical judgment and consideration of individual patient factors, including patient preferences, co-morbidities, previous drug use, risk factors not captured in the FRAX model (e.g. falls, vitamin D deficiency, increased bone turnover, interval significant decline in bone density) and possible under - or over-estimation of fracture risk by FRAX. All patients should ensure an adequate intake of dietary calcium (1200 mg/d) and vitamin D (800 IU daily) unless contraindicated. FOLLOW-UP: People with diagnosed cases of osteoporosis or at high risk for fracture should have regular bone mineral density tests. For patients eligible for Medicare, routine testing is allowed once every 2 years. The testing frequency can be increased to one year for patients who have rapidly progressing disease, those who are receiving or discontinuing medical therapy to restore bone mass, or have additional risk factors. I have reviewed this report, and agree with the above findings. Mark A. Thornton Papas, M.D. [facility] Radiology Electronically Signed   By: Lavonia Dana M.D.   On: 04/25/2017 17:29   Mm Screening Breast Tomo Bilateral  Result Date: 04/26/2017 CLINICAL DATA:  Screening. EXAM: 2D DIGITAL SCREENING BILATERAL MAMMOGRAM WITH CAD AND ADJUNCT TOMO COMPARISON:  Previous exam(s). ACR Breast Density Category d: The breast tissue is extremely dense, which lowers the sensitivity of mammography.  FINDINGS: There are no findings suspicious for malignancy. Images were processed with CAD. IMPRESSION: No mammographic evidence of malignancy. A result letter of this screening mammogram will be mailed directly to the patient. RECOMMENDATION: Screening mammogram in one year. (Code:SM-B-01Y) BI-RADS CATEGORY  1: Negative. Electronically Signed   By: Everlean Alstrom M.D.   On: 04/26/2017 12:53   Xr Hand Complete Right  Result Date: 04/26/2017 Three-view x-ray of the right hand reveals slight loss of position of the distal fifth metacarpal fracture. It appears that the distal fragment is volar and shortened.    PMFS History: Patient Active Problem List   Diagnosis Date Noted  . High risk medication use 03/15/2017  . Primary osteoarthritis of both hands 03/15/2017  . Pain in right hand 03/15/2017  . Age-related osteoporosis without current pathological fracture 03/15/2017  . Rheumatoid arthritis involving multiple sites with positive rheumatoid factor ([place]) 11/13/2016   Past Medical History:  Diagnosis Date  . Rheumatoid arthritis, adult ([place])   . Seizures (  [place])    epilepsy since teenager.   . Thyroid disease     Family History  Problem Relation Age of Onset  . Breast cancer Mother   . Colon cancer Father 48    Past Surgical History:  Procedure Laterality Date  . APPENDECTOMY     Social History   Occupational History  . Not on file.   Social History Main Topics  . Smoking status: Never Smoker  . Smokeless tobacco: Never Used  . Alcohol use No  . Drug use: No  . Sexual activity: Not on file

## 2017-04-27 ENCOUNTER — Encounter (HOSPITAL_COMMUNITY): Payer: Self-pay | Admitting: *Deleted

## 2017-04-27 NOTE — Pre-Procedure Instructions (Signed)
    Melinda Sparks  04/27/2017      [facility] [place], [address] [place] [address] Phone: [phone] Fax: [phone]  [facility] [place], [place] [place] [address] Phone: [phone] Fax: [phone]    Your procedure is scheduled on  May 29,th Tuesday  Report to [facility] Admitting at 7:30 am   Call this number if you have problems the morning of surgery:  [phone]   Remember:  Do not eat food or drink liquids after midnight Monday  Take these medicines the morning of surgery with A SIP OF WATER : Flonase, Norco, Levothyroxine?? , Plaquenil             4-5 days prior to surgery, stop taking vitamins, herbal supplements, anti-inflammatories.   Do not wear jewelry, make-up or nail polish.  Do not wear lotions, powders, or perfumes, or deoderant.  Do not shave 48 hours prior to surgery.  Men may shave face and neck.  Do not bring valuables to the hospital.  [facility] is not responsible for any belongings or valuables.  Contacts, dentures or bridgework may not be worn into surgery.  Leave your suitcase in the car.  After surgery it may be brought to your room.  For patients admitted to the hospital, discharge time will be determined by your treatment team.

## 2017-04-29 MED ORDER — CEFAZOLIN SODIUM-DEXTROSE 2-4 GM/100ML-% IV SOLN
2.0000 g | INTRAVENOUS | Status: AC
  Administered 2017-05-01: 2 g via INTRAVENOUS
  Filled 2017-04-29: qty 100

## 2017-04-29 MED ORDER — SODIUM CHLORIDE 0.9 % IV SOLN: INTRAVENOUS | Status: DC

## 2017-05-01 ENCOUNTER — Ambulatory Visit (HOSPITAL_COMMUNITY)
Admission: RE | Admit: 2017-05-01 | Discharge: 2017-05-01 | Disposition: A | Discharge status: Home / Self Care | Payer: Medicare HMO | Source: Ambulatory Visit | Attending: Orthopaedic Surgery | Admitting: Orthopaedic Surgery

## 2017-05-01 ENCOUNTER — Encounter (HOSPITAL_COMMUNITY): Payer: Self-pay | Admitting: Certified Registered Nurse Anesthetist

## 2017-05-01 ENCOUNTER — Ambulatory Visit (HOSPITAL_COMMUNITY): Payer: Medicare HMO | Admitting: Certified Registered Nurse Anesthetist

## 2017-05-01 ENCOUNTER — Encounter (HOSPITAL_COMMUNITY)
Admission: RE | Disposition: A | Discharge status: Home / Self Care | Payer: Self-pay | Source: Ambulatory Visit | Attending: Orthopaedic Surgery

## 2017-05-01 DIAGNOSIS — M79641 Pain in right hand: Secondary | ICD-10-CM | POA: Diagnosis not present

## 2017-05-01 DIAGNOSIS — M0579 Rheumatoid arthritis with rheumatoid factor of multiple sites without organ or systems involvement: Secondary | ICD-10-CM | POA: Insufficient documentation

## 2017-05-01 DIAGNOSIS — S62326A Displaced fracture of shaft of fifth metacarpal bone, right hand, initial encounter for closed fracture: Secondary | ICD-10-CM | POA: Diagnosis not present

## 2017-05-01 DIAGNOSIS — Z79899 Other long term (current) drug therapy: Secondary | ICD-10-CM | POA: Diagnosis not present

## 2017-05-01 DIAGNOSIS — W1800XA Striking against unspecified object with subsequent fall, initial encounter: Secondary | ICD-10-CM | POA: Diagnosis not present

## 2017-05-01 DIAGNOSIS — G40909 Epilepsy, unspecified, not intractable, without status epilepticus: Secondary | ICD-10-CM | POA: Insufficient documentation

## 2017-05-01 DIAGNOSIS — S62339A Displaced fracture of neck of unspecified metacarpal bone, initial encounter for closed fracture: Secondary | ICD-10-CM | POA: Diagnosis not present

## 2017-05-01 DIAGNOSIS — Z7983 Long term (current) use of bisphosphonates: Secondary | ICD-10-CM | POA: Diagnosis not present

## 2017-05-01 DIAGNOSIS — E039 Hypothyroidism, unspecified: Secondary | ICD-10-CM | POA: Insufficient documentation

## 2017-05-01 HISTORY — DX: Hypothyroidism, unspecified: E03.9

## 2017-05-01 HISTORY — PX: CLOSED REDUCTION METACARPAL WITH PERCUTANEOUS PINNING: SHX5613

## 2017-05-01 LAB — CBC
HEMATOCRIT: 41.2 % (ref 36.0–46.0)
HEMOGLOBIN: 13.8 g/dL (ref 12.0–15.0)
MCH: 30.7 pg (ref 26.0–34.0)
MCHC: 33.5 g/dL (ref 30.0–36.0)
MCV: 91.8 fL (ref 78.0–100.0)
Platelets: 282 10*3/uL (ref 150–400)
RBC: 4.49 MIL/uL (ref 3.87–5.11)
RDW: 13 % (ref 11.5–15.5)
WBC: 5.4 10*3/uL (ref 4.0–10.5)

## 2017-05-01 LAB — BASIC METABOLIC PANEL
ANION GAP: 8 (ref 5–15)
BUN: 17 mg/dL (ref 6–20)
CO2: 24 mmol/L (ref 22–32)
Calcium: 9.6 mg/dL (ref 8.9–10.3)
Chloride: 105 mmol/L (ref 101–111)
Creatinine, Ser: 0.9 mg/dL (ref 0.44–1.00)
GFR calc Af Amer: >60 mL/min (ref >60)
GFR calc non Af Amer: >60 mL/min (ref >60)
GLUCOSE: 88 mg/dL (ref 65–99)
POTASSIUM: 3.9 mmol/L (ref 3.5–5.1)
Sodium: 137 mmol/L (ref 135–145)

## 2017-05-01 SURGERY — CLOSED REDUCTION, FRACTURE, METACARPAL BONE, WITH PERCUTANEOUS PINNING
Anesthesia: General | Site: Finger | Laterality: Right

## 2017-05-01 MED ORDER — PHENYLEPHRINE HCL 10 MG/ML IJ SOLN
INTRAMUSCULAR | Status: DC | PRN
  Administered 2017-05-01 (×3): 80 ug via INTRAVENOUS

## 2017-05-01 MED ORDER — LACTATED RINGERS IV SOLN
INTRAVENOUS | Status: DC
  Administered 2017-05-01: 09:00:00 via INTRAVENOUS

## 2017-05-01 MED ORDER — MIDAZOLAM HCL 2 MG/2ML IJ SOLN
INTRAMUSCULAR | Status: AC
  Administered 2017-05-01: 2 mg via INTRAVENOUS
  Filled 2017-05-01: qty 2

## 2017-05-01 MED ORDER — MIDAZOLAM HCL 2 MG/2ML IJ SOLN
INTRAMUSCULAR | Status: AC
  Filled 2017-05-01: qty 2

## 2017-05-01 MED ORDER — FENTANYL CITRATE (PF) 100 MCG/2ML IJ SOLN
50.0000 ug | Freq: Once | INTRAMUSCULAR | Status: AC
  Administered 2017-05-01: 50 ug via INTRAVENOUS

## 2017-05-01 MED ORDER — DEXAMETHASONE SODIUM PHOSPHATE 10 MG/ML IJ SOLN
INTRAMUSCULAR | Status: AC
  Filled 2017-05-01: qty 1

## 2017-05-01 MED ORDER — PROPOFOL 10 MG/ML IV BOLUS
INTRAVENOUS | Status: DC | PRN
  Administered 2017-05-01: 110 mg via INTRAVENOUS

## 2017-05-01 MED ORDER — FENTANYL CITRATE (PF) 250 MCG/5ML IJ SOLN
INTRAMUSCULAR | Status: AC
  Filled 2017-05-01: qty 5

## 2017-05-01 MED ORDER — FENTANYL CITRATE (PF) 100 MCG/2ML IJ SOLN
INTRAMUSCULAR | Status: DC | PRN
Start: 2017-05-01 — End: 2017-05-01
  Administered 2017-05-01: 25 ug via INTRAVENOUS

## 2017-05-01 MED ORDER — FENTANYL CITRATE (PF) 100 MCG/2ML IJ SOLN
INTRAMUSCULAR | Status: AC
  Administered 2017-05-01: 50 ug via INTRAVENOUS
  Filled 2017-05-01: qty 2

## 2017-05-01 MED ORDER — ONDANSETRON HCL 4 MG/2ML IJ SOLN
INTRAMUSCULAR | Status: DC | PRN
  Administered 2017-05-01: 4 mg via INTRAVENOUS

## 2017-05-01 MED ORDER — LIDOCAINE 2% (20 MG/ML) 5 ML SYRINGE
INTRAMUSCULAR | Status: DC | PRN
  Administered 2017-05-01: 100 mg via INTRAVENOUS

## 2017-05-01 MED ORDER — DEXAMETHASONE SODIUM PHOSPHATE 10 MG/ML IJ SOLN
INTRAMUSCULAR | Status: DC | PRN
  Administered 2017-05-01: 10 mg via INTRAVENOUS

## 2017-05-01 MED ORDER — ONDANSETRON HCL 4 MG/2ML IJ SOLN
INTRAMUSCULAR | Status: AC
  Filled 2017-05-01: qty 2

## 2017-05-01 MED ORDER — BUPIVACAINE-EPINEPHRINE (PF) 0.5% -1:200000 IJ SOLN
INTRAMUSCULAR | Status: DC | PRN
  Administered 2017-05-01: 20 mL via PERINEURAL

## 2017-05-01 MED ORDER — LIDOCAINE 2% (20 MG/ML) 5 ML SYRINGE
INTRAMUSCULAR | Status: AC
  Filled 2017-05-01: qty 5

## 2017-05-01 MED ORDER — MIDAZOLAM HCL 2 MG/2ML IJ SOLN
2.0000 mg | Freq: Once | INTRAMUSCULAR | Status: AC
  Administered 2017-05-01: 2 mg via INTRAVENOUS

## 2017-05-01 SURGICAL SUPPLY — 38 items
BANDAGE ACE 3X5.8 VEL STRL LF (GAUZE/BANDAGES/DRESSINGS) ×3 IMPLANT
BANDAGE ACE 4X5 VEL STRL LF (GAUZE/BANDAGES/DRESSINGS) ×3 IMPLANT
BENZOIN TINCTURE PRP APPL 2/3 (GAUZE/BANDAGES/DRESSINGS) IMPLANT
BLADE CLIPPER SURG (BLADE) IMPLANT
BNDG GAUZE ELAST 4 BULKY (GAUZE/BANDAGES/DRESSINGS) ×3 IMPLANT
CLOSURE WOUND 1/2 X4 (GAUZE/BANDAGES/DRESSINGS)
COVER SURGICAL LIGHT HANDLE (MISCELLANEOUS) ×3 IMPLANT
CUFF TOURNIQUET SINGLE 18IN (TOURNIQUET CUFF) IMPLANT
CUFF TOURNIQUET SINGLE 24IN (TOURNIQUET CUFF) IMPLANT
DRAPE C-ARM MINI 42X72 WSTRAPS (DRAPES) ×3 IMPLANT
DRSG EMULSION OIL 3X3 NADH (GAUZE/BANDAGES/DRESSINGS) ×3 IMPLANT
DURAPREP 26ML APPLICATOR (WOUND CARE) ×3 IMPLANT
GAUZE SPONGE 4X4 12PLY STRL (GAUZE/BANDAGES/DRESSINGS) ×3 IMPLANT
GAUZE XEROFORM 1X8 LF (GAUZE/BANDAGES/DRESSINGS) ×3 IMPLANT
GLOVE BIOGEL PI IND STRL 8 (GLOVE) ×1 IMPLANT
GLOVE BIOGEL PI INDICATOR 8 (GLOVE) ×2
GLOVE ECLIPSE 8.0 STRL XLNG CF (GLOVE) ×3 IMPLANT
GOWN STRL REUS W/ TWL LRG LVL3 (GOWN DISPOSABLE) ×2 IMPLANT
GOWN STRL REUS W/TWL LRG LVL3 (GOWN DISPOSABLE) ×4
KIT BASIN OR (CUSTOM PROCEDURE TRAY) ×3 IMPLANT
KIT ROOM TURNOVER OR (KITS) ×3 IMPLANT
KWIRE 1.1 (Wire) ×3 IMPLANT
MANIFOLD NEPTUNE II (INSTRUMENTS) IMPLANT
NS IRRIG 1000ML POUR BTL (IV SOLUTION) IMPLANT
PACK ORTHO EXTREMITY (CUSTOM PROCEDURE TRAY) ×3 IMPLANT
PAD ARMBOARD 7.5X6 YLW CONV (MISCELLANEOUS) ×6 IMPLANT
PAD CAST 3X4 CTTN HI CHSV (CAST SUPPLIES) ×1 IMPLANT
PAD CAST 4YDX4 CTTN HI CHSV (CAST SUPPLIES) ×1 IMPLANT
PADDING CAST COTTON 3X4 STRL (CAST SUPPLIES) ×2
PADDING CAST COTTON 4X4 STRL (CAST SUPPLIES) ×2
STRIP CLOSURE SKIN 1/2X4 (GAUZE/BANDAGES/DRESSINGS) IMPLANT
SUT ETHILON 4 0 P 3 18 (SUTURE) IMPLANT
SUT ETHILON 5 0 P 3 18 (SUTURE)
SUT NYLON ETHILON 5-0 P-3 1X18 (SUTURE) IMPLANT
SUT PROLENE 4 0 P 3 18 (SUTURE) IMPLANT
TOWEL OR 17X24 6PK STRL BLUE (TOWEL DISPOSABLE) ×3 IMPLANT
TOWEL OR 17X26 10 PK STRL BLUE (TOWEL DISPOSABLE) ×3 IMPLANT
WATER STERILE IRR 1000ML POUR (IV SOLUTION) IMPLANT

## 2017-05-01 NOTE — Op Note (Signed)
NAME:  Melinda Sparks, Melinda Sparks                   ACCOUNT NO.:  MEDICAL RECORD NO.:  [id number]  LOCATION:                                 FACILITY:  PHYSICIAN:  Vonna Kotyk. Durward Fortes, M.D.    DATE OF BIRTH:  DATE OF PROCEDURE:  05/01/2017 DATE OF DISCHARGE:                              OPERATIVE REPORT   PREOPERATIVE DIAGNOSIS:  Displaced, angulated right little distal metacarpal fracture (boxer's fracture).  POSTOPERATIVE DIAGNOSIS:  Displaced, angulated right little distal metacarpal fracture (boxer's fracture).  PROCEDURE:  Closed reduction and percutaneous pinning.  SURGEON:  Vonna Kotyk. Durward Fortes, M.D.  ASSISTANT:  Aaron Edelman D. Petrarca, P.A.-C.  ANESTHESIA:  General laryngal and supraclavicular nerve block.  COMPLICATIONS:  None.  HISTORY:  A 61 year old female was seen in the office last week with evidence of a displaced little metacarpal fracture, consistent with a boxer's fracture.  We tried closed reduction on several occasions, but because of the nature of the fracture and the fact that it was short, oblique, and was quite unstable, we could not maintain position.  She is now to have closed reduction and percutaneous pinning.  DESCRIPTION OF PROCEDURE:  [name] was met with the family in the holding area, identified the right hand as the appropriate operative site, and marked it accordingly.  Anesthesia was performed with supraclavicular nerve block.  The patient was then transported to room #7, placed under general laryngal anesthesia without difficulty.  Time-out was called.  The right upper extremity was prepped with chlorhexidine scrub and then DuraPrep in the tips of the fingers to the elbow.  Sterile draping was performed. Time-out was called again.  We used the mini C-arm and manipulated the fracture.  We could get it quite close, but we decided to insert a transverse pin into the distal portion of the fracture and metacarpal head and then using that as a lever  and we were able to maintain reduction of the fracture and then holding that with finger traps, we then folded the pin into the fourth metacarpal head.  At that point, we thought the fracture was quite stable and checking it with several projections.  Sterile bulky dressing was applied.  Xeroform gauze was placed over the pin.  The pin was bent, then followed this with a fiberglass splint.  Tourniquet was not utilized.  There was good capillary refill to the finger though we had very good alignment.  The patient was then awoke and returned to the postanesthesia recovery room in satisfactory condition.  PLAN:  Office next week.  The patient has oxycodone at home as well as ibuprofen.     Vonna Kotyk. Durward Fortes, M.D.     PWW/MEDQ  D:  05/01/2017  T:  05/01/2017  Job:  [id number]

## 2017-05-01 NOTE — Op Note (Signed)
PATIENT ID:      Melinda Sparks  MRN:     [id number] DOB/AGE:    1956/05/13 / 61 y.o.       OPERATIVE REPORT    DATE OF PROCEDURE:  05/01/2017       PREOPERATIVE DIAGNOSIS:DISPLACED, ANGULATED   RIGHT FIFTH METACARPAL FRACTURE                                                       Estimated body mass index is 18.37 kg/m as calculated from the following:   Height as of this encounter: 5\' 4"  (1.626 m).   Weight as of this encounter: 107 lb (48.5 kg).     POSTOPERATIVE DIAGNOSIS:   RIGHT FIFTH METACARPAL FRACTURE -SAME                                                                    Estimated body mass index is 18.37 kg/m as calculated from the following:   Height as of this encounter: 5\' 4"  (1.626 m).   Weight as of this encounter: 107 lb (48.5 kg).     PROCEDURE:  Procedure(s): CLOSED REDUCTION RIGHT LITTLE METACARPAL FRACTURE WITH PERCUTANEOUS PINNING      SURGEON:  Joni Fears, MD    ASSISTANT:   Biagio Borg, PA-C   (Present and scrubbed throughout the case, critical for assistance with exposure, retraction, instrumentation, and closure.)          ANESTHESIA: regional and general     DRAINS: none :      TOURNIQUET TIME: * No tourniquets in log *    COMPLICATIONS:  None   CONDITION:  stable  PROCEDURE IN DETAIL: [place] 05/01/2017, 11:04 AM

## 2017-05-01 NOTE — Anesthesia Preprocedure Evaluation (Signed)
Anesthesia Evaluation  Patient identified by MRN, date of birth, ID band Patient awake    Reviewed: Allergy & Precautions, NPO status , Patient's Chart, lab work & pertinent test results  Airway Mallampati: II  TM Distance: >3 FB Neck ROM: Full    Dental no notable dental hx.    Pulmonary neg pulmonary ROS,    Pulmonary exam normal breath sounds clear to auscultation       Cardiovascular negative cardio ROS Normal cardiovascular exam Rhythm:Regular Rate:Normal     Neuro/Psych Seizures -, Poorly Controlled,  negative psych ROS   GI/Hepatic negative GI ROS, Neg liver ROS,   Endo/Other  Hypothyroidism   Renal/GU negative Renal ROS  negative genitourinary   Musculoskeletal negative musculoskeletal ROS (+)   Abdominal   Peds negative pediatric ROS (+)  Hematology negative hematology ROS (+)   Anesthesia Other Findings   Reproductive/Obstetrics negative OB ROS                             Anesthesia Physical Anesthesia Plan  ASA: II  Anesthesia Plan: General   Post-op Pain Management:  Regional for Post-op pain   Induction: Intravenous  Airway Management Planned: LMA  Additional Equipment:   Intra-op Plan:   Post-operative Plan: Extubation in OR  Informed Consent: I have reviewed the patients History and Physical, chart, labs and discussed the procedure including the risks, benefits and alternatives for the proposed anesthesia with the patient or authorized representative who has indicated his/her understanding and acceptance.   Dental advisory given  Plan Discussed with: CRNA  Anesthesia Plan Comments: (SCB)        Anesthesia Quick Evaluation

## 2017-05-01 NOTE — Progress Notes (Signed)
Benjie Karvonen, CRNA at bedside monitoring patient.

## 2017-05-01 NOTE — Transfer of Care (Signed)
Immediate Anesthesia Transfer of Care Note  Patient: Melinda Sparks  Procedure(s) Performed: Procedure(s): CLOSED REDUCTION METACARPAL WITH PERCUTANEOUS PINNING (Right)  Patient Location: PACU  Anesthesia Type:GA combined with regional for post-op pain  Level of Consciousness: awake, alert  and oriented  Airway & Oxygen Therapy: Patient Spontanous Breathing and Patient connected to nasal cannula oxygen  Post-op Assessment: Report given to RN and Post -op Vital signs reviewed and stable  Post vital signs: Reviewed and stable  Last Vitals:  Vitals:   05/01/17 1004 05/01/17 1005  BP:    Pulse: 76 (!) 36  Resp: 20 15  Temp:      Last Pain:  Vitals:   05/01/17 0827  TempSrc:   PainSc: 6       Patients Stated Pain Goal: 5 (53/66/44 0347)  Complications: No apparent anesthesia complications

## 2017-05-01 NOTE — Progress Notes (Signed)
Orthopedic Tech Progress Note Patient Details:  Melinda Sparks 18-Jun-1956 [id number]  Ortho Devices Type of Ortho Device: Arm sling Ortho Device/Splint Interventions: Application   [name] 05/01/2017, 12:49 PM

## 2017-05-01 NOTE — Anesthesia Procedure Notes (Signed)
Procedure Name: LMA Insertion Date/Time: 05/01/2017 10:31 AM Performed by: Candis Shine Pre-anesthesia Checklist: Patient identified, Emergency Drugs available, Suction available and Patient being monitored Patient Re-evaluated:Patient Re-evaluated prior to inductionOxygen Delivery Method: Circle System Utilized Preoxygenation: Pre-oxygenation with 100% oxygen Intubation Type: IV induction Ventilation: Mask ventilation without difficulty LMA: LMA inserted LMA Size: 3.0 Number of attempts: 1 Airway Equipment and Method: Bite block Placement Confirmation: positive ETCO2 Tube secured with: Tape Dental Injury: Teeth and Oropharynx as per pre-operative assessment

## 2017-05-01 NOTE — Anesthesia Postprocedure Evaluation (Signed)
Anesthesia Post Note  Patient: RAMAH LANGHANS  Procedure(s) Performed: Procedure(s) (LRB): CLOSED REDUCTION METACARPAL WITH PERCUTANEOUS PINNING (Right)  Patient location during evaluation: PACU Anesthesia Type: General and Regional Level of consciousness: awake and alert Pain management: pain level controlled Vital Signs Assessment: post-procedure vital signs reviewed and stable Respiratory status: spontaneous breathing, nonlabored ventilation, respiratory function stable and patient connected to nasal cannula oxygen Cardiovascular status: blood pressure returned to baseline and stable Postop Assessment: no signs of nausea or vomiting Anesthetic complications: no       Last Vitals:  Vitals:   05/01/17 1200 05/01/17 1213  BP: 101/64 110/79  Pulse: 63 80  Resp: 12   Temp: 36.3 C     Last Pain:  Vitals:   05/01/17 1213  TempSrc:   PainSc: 0-No pain                 Montez Hageman

## 2017-05-01 NOTE — Anesthesia Procedure Notes (Signed)
Anesthesia Regional Block: Supraclavicular block   Pre-Anesthetic Checklist: ,, timeout performed, Correct Patient, Correct Site, Correct Laterality, Correct Procedure, Correct Position, site marked, Risks and benefits discussed,  Surgical consent,  Pre-op evaluation,  At surgeon's request and post-op pain management  Laterality: Right and Upper  Prep: Maximum Sterile Barrier Precautions used, chloraprep       Needles:  Injection technique: Single-shot  Needle Type: Echogenic Stimulator Needle     Needle Length: 10cm      Additional Needles:   Procedures: ultrasound guided,,,,,,,,  Narrative:  Start time: 05/01/2017 9:49 AM End time: 05/01/2017 9:59 AM Injection made incrementally with aspirations every 5 mL.  Performed by: Personally  Anesthesiologist: Montez Hageman  Additional Notes: Risks, benefits and alternative to block explained extensively.  Patient tolerated procedure well, without complications.

## 2017-05-02 ENCOUNTER — Encounter (HOSPITAL_COMMUNITY): Payer: Self-pay | Admitting: Orthopaedic Surgery

## 2017-05-02 ENCOUNTER — Ambulatory Visit (INDEPENDENT_AMBULATORY_CARE_PROVIDER_SITE_OTHER): Payer: Medicare HMO | Admitting: Orthopedic Surgery

## 2017-05-02 ENCOUNTER — Ambulatory Visit: Payer: Medicare HMO | Admitting: Rheumatology

## 2017-05-08 ENCOUNTER — Ambulatory Visit (INDEPENDENT_AMBULATORY_CARE_PROVIDER_SITE_OTHER): Payer: Medicare HMO

## 2017-05-08 ENCOUNTER — Ambulatory Visit (INDEPENDENT_AMBULATORY_CARE_PROVIDER_SITE_OTHER): Payer: Medicare HMO | Admitting: Orthopaedic Surgery

## 2017-05-08 ENCOUNTER — Encounter (INDEPENDENT_AMBULATORY_CARE_PROVIDER_SITE_OTHER): Payer: Self-pay | Admitting: Orthopaedic Surgery

## 2017-05-08 VITALS — BP 120/74 | HR 78 | Resp 14 | Ht 64.0 in | Wt 107.0 lb

## 2017-05-08 DIAGNOSIS — S62336D Displaced fracture of neck of fifth metacarpal bone, right hand, subsequent encounter for fracture with routine healing: Secondary | ICD-10-CM | POA: Diagnosis not present

## 2017-05-08 NOTE — Progress Notes (Signed)
Office Visit Note   Patient: Melinda Sparks           Date of Birth: August 29, 1956           MRN: [id number] Visit Date: 05/08/2017              Requested by: Bernerd Limbo, MD [place], [place] [address] PCP: Bernerd Limbo, MD   Assessment & Plan: Visit Diagnoses:  1. Closed displaced fracture of neck of fifth metacarpal bone of right hand with routine healing, subsequent encounter     Plan:  #1: Patient dressed with mupirocin and the fracture was then splinted again.  Follow-Up Instructions: Return in about 2 weeks (around 05/22/2017).   Orders:  Orders Placed This Encounter  Procedures  . XR Hand Complete Right   No orders of the defined types were placed in this encounter.     Procedures: No procedures performed   Clinical Data: No additional findings.   Subjective: Chief Complaint  Patient presents with  . Right Hand - Follow-up    Melinda Sparks is a 61 year old white female who returns today for follow-up of her right hand pain. History is that she had a seizure previously fell onto water pipe injuring her rib as well as her fifth metacarpal. Initially seen at [facility] and placed in a splint. At that time was that she had a displaced oblique boxer's fracture of the fifth metacarpal of the right hand. Close reduction was performed but was not the held very well joint and the recuperation. She was taken to the operating room where she underwent a closed reduction and pinning of the distal fifth metacarpal. Seen today for follow-up. She denies any numbness or tingling in the finger at this time.       Review of Systems  Neurological: Positive for seizures.  All other systems reviewed and are negative.    Objective: Vital Signs: BP 120/74   Pulse 78   Resp 14   Ht 5\' 4"  (1.626 m)   Wt 107 lb (48.5 kg)   BMI 18.37 kg/m   Physical Exam  Ortho Exam  The pin is intact. No signs of infection. Good capillary  refill noted. Sensation intact to light touch.  Specialty Comments:  No specialty comments available.  Imaging: Xr Hand Complete Right  Result Date: 05/08/2017 Three-view x-ray of the right hand reveals maintenance of position of the fracture. Dr. Durward Fortes felt it was acceptable.    PMFS History: Patient Active Problem List   Diagnosis Date Noted  . Fracture of metacarpal neck of right hand, closed 05/01/2017  . High risk medication use 03/15/2017  . Primary osteoarthritis of both hands 03/15/2017  . Pain in right hand 03/15/2017  . Age-related osteoporosis without current pathological fracture 03/15/2017  . Rheumatoid arthritis involving multiple sites with positive rheumatoid factor ([place]) 11/13/2016   Past Medical History:  Diagnosis Date  . Hypothyroidism   . Rheumatoid arthritis, adult ([place])   . Seizures ([place])    epilepsy since teenager.   . Thyroid disease     Family History  Problem Relation Age of Onset  . Breast cancer Mother   . Colon cancer Father 37    Past Surgical History:  Procedure Laterality Date  . APPENDECTOMY    . CLOSED REDUCTION METACARPAL WITH PERCUTANEOUS PINNING Right 05/01/2017   Procedure: CLOSED REDUCTION METACARPAL WITH PERCUTANEOUS PINNING;  Surgeon: Garald Balding, MD;  Location: [facility];  Service: Orthopedics;  Laterality: Right;   Social History   Occupational History  . Not on file.   Social History Main Topics  . Smoking status: Never Smoker  . Smokeless tobacco: Never Used  . Alcohol use No  . Drug use: No  . Sexual activity: Not on file

## 2017-05-14 ENCOUNTER — Ambulatory Visit (INDEPENDENT_AMBULATORY_CARE_PROVIDER_SITE_OTHER): Payer: Medicare HMO | Admitting: Orthopaedic Surgery

## 2017-05-28 ENCOUNTER — Encounter (INDEPENDENT_AMBULATORY_CARE_PROVIDER_SITE_OTHER): Payer: Self-pay | Admitting: Orthopaedic Surgery

## 2017-05-28 ENCOUNTER — Ambulatory Visit (INDEPENDENT_AMBULATORY_CARE_PROVIDER_SITE_OTHER): Payer: Medicare HMO

## 2017-05-28 ENCOUNTER — Ambulatory Visit (INDEPENDENT_AMBULATORY_CARE_PROVIDER_SITE_OTHER): Payer: Medicare HMO | Admitting: Orthopaedic Surgery

## 2017-05-28 VITALS — BP 109/66 | HR 89 | Resp 14 | Ht 63.0 in | Wt 107.0 lb

## 2017-05-28 DIAGNOSIS — M79641 Pain in right hand: Secondary | ICD-10-CM | POA: Diagnosis not present
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
## 2017-06-05 ENCOUNTER — Encounter (INDEPENDENT_AMBULATORY_CARE_PROVIDER_SITE_OTHER): Payer: Self-pay | Admitting: Orthopaedic Surgery

## 2017-06-05 ENCOUNTER — Ambulatory Visit (INDEPENDENT_AMBULATORY_CARE_PROVIDER_SITE_OTHER): Payer: Medicare HMO | Admitting: Orthopaedic Surgery

## 2017-06-05 VITALS — BP 101/65 | HR 70 | Ht 64.0 in | Wt 107.0 lb

## 2017-06-05 DIAGNOSIS — M79641 Pain in right hand: Secondary | ICD-10-CM

## 2017-06-05 NOTE — Progress Notes (Signed)
   Office Visit Note   Patient: Melinda Sparks           Date of Birth: Jun 23, 1956           MRN: [id number] Visit Date: 06/05/2017              Requested by: Bernerd Limbo, MD [place],  [id number] PCP: Bernerd Limbo, MD   Assessment & Plan: Visit Diagnoses:  1. Pain of right hand   5 weeks status post closed reduction and percutaneous pin fixation of the displaced right little metacarpal fracture. The pin was removed a week ago she's doing well.  Plan: Continue with home exercises, office 1 month  Follow-Up Instructions: Return in about 1 month (around 07/06/2017).   Orders:  No orders of the defined types were placed in this encounter.  No orders of the defined types were placed in this encounter.     Procedures: No procedures performed   Clinical Data: No additional findings.   Subjective: Chief Complaint  Patient presents with  . Right Hand - Routine Post Op    Melinda Sparks is a 39 y old that is status post  1 week status post pin removal from right little metacarpal fracture. 5 weeks status post close reduction and percutaneous pin fixation. Doing well without problem.  HPI  Review of Systems   Objective: Vital Signs: BP 101/65   Pulse 70   Ht 5\' 4"  (1.626 m)   Wt 107 lb (48.5 kg)   BMI 18.37 kg/m   Physical Exam  Ortho Exam Alignment of right little finger. Minimal swelling. Able to touch the tip of her finger to the palm of her hand. No significant pain. Neurovascular exam intact. Abducts and adducts normally  Specialty Comments:  No specialty comments available.  Imaging: No results found.   PMFS History: Patient Active Problem List   Diagnosis Date Noted  . Fracture of metacarpal neck of right hand, closed 05/01/2017  . High risk medication use 03/15/2017  . Primary osteoarthritis of both hands 03/15/2017  . Pain in right hand 03/15/2017  . Age-related osteoporosis without current pathological fracture  03/15/2017  . Rheumatoid arthritis involving multiple sites with positive rheumatoid factor ([place]) 11/13/2016   Past Medical History:  Diagnosis Date  . Hypothyroidism   . Rheumatoid arthritis, adult ([place])   . 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   . Thyroid disease     Family History  Problem Relation Age of Onset  . Breast cancer Mother   . Colon cancer Father 82    Past Surgical History:  Procedure Laterality Date  . APPENDECTOMY    . CLOSED REDUCTION METACARPAL WITH PERCUTANEOUS PINNING Right 05/01/2017   Procedure: CLOSED REDUCTION METACARPAL WITH PERCUTANEOUS PINNING;  Surgeon: Garald Balding, MD;  Location: [place];  Service: Orthopedics;  Laterality: Right;   Social History   Occupational History  . Not on file.   Social History Main Topics  . Smoking status: Never Smoker  . Smokeless tobacco: Never Used  . Alcohol use No  . Drug use: No  . Sexual activity: Not on file     Garald Balding, MD   Note - This record has been created using [employer].  Chart creation errors have been sought, but may not always  have been located. Such creation errors do not reflect on  the standard of medical care.

## 2017-06-09 ENCOUNTER — Other Ambulatory Visit: Payer: Self-pay | Admitting: Rheumatology

## 2017-06-11 NOTE — Telephone Encounter (Signed)
Last Visit: 03/20/17 Next Visit: 07/26/17 Labs: 05/01/17 WNL PLQ Eye Exam: 10/2016 WNL  Okay to refill per Dr. Estanislado Pandy

## 2017-07-16 ENCOUNTER — Ambulatory Visit (INDEPENDENT_AMBULATORY_CARE_PROVIDER_SITE_OTHER): Payer: Medicare HMO | Admitting: Orthopaedic Surgery

## 2017-07-16 DIAGNOSIS — S62336D Displaced fracture of neck of fifth metacarpal bone, right hand, subsequent encounter for fracture with routine healing: Secondary | ICD-10-CM

## 2017-07-16 NOTE — Progress Notes (Signed)
   Office Visit Note   Patient: SEASON ASTACIO           Date of Birth: 12-06-1955           MRN: [id number] Visit Date: 07/16/2017              Requested by: Bernerd Limbo, MD [id number] [place],  [id number] PCP: Bernerd Limbo, MD   Assessment & Plan: Visit Diagnoses:  1. Closed displaced fracture of neck of fifth metacarpal bone of right hand with routine healing, subsequent encounter     Plan: Doing very well in terms of function. No pain. Normal range of motion of fingers. We'll see back as needed  Follow-Up Instructions: Return if symptoms worsen or fail to improve.   Orders:  No orders of the defined types were placed in this encounter.  No orders of the defined types were placed in this encounter.     Procedures: No procedures performed   Clinical Data: No additional findings.   Subjective: No chief complaint on file. 2 months status post closed reduction percutaneous pin fixation of right fifth met carpal fracture distally. Doing very well. No complaints  HPI  Review of Systems   Objective: Vital Signs: There were no vitals taken for this visit.  Physical Exam  Ortho Exam full range of motion of right hand and specifically little finger. Skin intact. Neurovascular exam intact. Pain over the fifth metacarpal head. No deformity  Specialty Comments:  No specialty comments available.  Imaging: No results found.   PMFS History: Patient Active Problem List   Diagnosis Date Noted  . Fracture of metacarpal neck of right hand, closed 05/01/2017  . High risk medication use 03/15/2017  . Primary osteoarthritis of both hands 03/15/2017  . Pain in right hand 03/15/2017  . Age-related osteoporosis without current pathological fracture 03/15/2017  . Rheumatoid arthritis involving multiple sites with positive rheumatoid factor ([place]) 11/13/2016   Past Medical History:  Diagnosis Date  . Hypothyroidism   . Rheumatoid arthritis, adult ([place])    . 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   . Thyroid disease     Family History  Problem Relation Age of Onset  . Breast cancer Mother   . Colon cancer Father 68    Past Surgical History:  Procedure Laterality Date  . APPENDECTOMY    . CLOSED REDUCTION METACARPAL WITH PERCUTANEOUS PINNING Right 05/01/2017   Procedure: CLOSED REDUCTION METACARPAL WITH PERCUTANEOUS PINNING;  Surgeon: Garald Balding, MD;  Location: [facility];  Service: Orthopedics;  Laterality: Right;   Social History   Occupational History  . Not on file.   Social History Main Topics  . Smoking status: Never Smoker  . Smokeless tobacco: Never Used  . Alcohol use No  . Drug use: No  . Sexual activity: Not on file     Garald Balding, MD   Note - This record has been created using [employer].  Chart creation errors have been sought, but may not always  have been located. Such creation errors do not reflect on  the standard of medical care.

## 2017-07-19 ENCOUNTER — Ambulatory Visit: Payer: Medicare HMO | Admitting: Rheumatology

## 2017-07-24 DIAGNOSIS — M19072 Primary osteoarthritis, left ankle and foot: Secondary | ICD-10-CM

## 2017-07-24 DIAGNOSIS — M170 Bilateral primary osteoarthritis of knee: Secondary | ICD-10-CM | POA: Insufficient documentation

## 2017-07-24 DIAGNOSIS — M19071 Primary osteoarthritis, right ankle and foot: Secondary | ICD-10-CM | POA: Insufficient documentation

## 2017-07-24 NOTE — Progress Notes (Signed)
Office Visit Note  Patient: Melinda Sparks             Date of Birth: March 28, 1956           MRN: [id number]             PCP: Bernerd Limbo, MD Referring: Bernerd Limbo, MD Visit Date: 07/26/2017 Occupation: @GUAROCC @    Subjective:  Pain hands.   History of Present Illness: Melinda Sparks is a 61 y.o. female with history of sero positive rheumatoid arthritis. She states she's been having some discomfort in her hands. She has noticed some swelling in her hands. None of the other joints are painful.  Activities of Daily Living:  Patient reports morning stiffness for 1 minute.   Patient Denies nocturnal pain.  Difficulty dressing/grooming: Denies Difficulty climbing stairs: Denies Difficulty getting out of chair: Denies Difficulty using hands for taps, buttons, cutlery, and/or writing: Denies   Review of Systems  Constitutional: Negative for fatigue and night sweats.  HENT: Negative for mouth dryness.   Eyes: Negative for dryness.  Respiratory: Negative.  Negative for cough, shortness of breath and difficulty breathing.   Cardiovascular: Negative.  Positive for hypertension. Negative for palpitations and irregular heartbeat.  Gastrointestinal: Negative.  Negative for blood in stool, constipation and diarrhea.  Endocrine: Negative.   Genitourinary: Negative.  Negative for nocturia.  Musculoskeletal: Positive for arthralgias, joint pain and joint swelling. Negative for muscle weakness and morning stiffness.  Skin: Negative.  Negative for color change, rash, hair loss, ulcers and sensitivity to sunlight.  Neurological: Positive for seizures. Negative for headaches and night sweats.  Hematological: Negative.  Negative for swollen glands.  Psychiatric/Behavioral: Negative.  Negative for depressed mood and sleep disturbance. The patient is not nervous/anxious.     PMFS History:  Patient Active Problem List   Diagnosis Date Noted  . Primary osteoarthritis of both feet  07/24/2017  . Primary osteoarthritis of both knees 07/24/2017  . Fracture of metacarpal neck of right hand, closed 05/01/2017  . High risk medication use 03/15/2017  . Primary osteoarthritis of both hands 03/15/2017  . Pain in right hand 03/15/2017  . Age-related osteoporosis without current pathological fracture 03/15/2017  . Rheumatoid arthritis involving multiple sites with positive rheumatoid factor + CCP MTP Erosions ([place]) 11/13/2016    Past Medical History:  Diagnosis Date  . Hypothyroidism   . Rheumatoid arthritis, adult ([place])   . 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   . Thyroid disease     Family History  Problem Relation Age of Onset  . Breast cancer Mother   . Colon cancer Father 74   Past Surgical History:  Procedure Laterality Date  . APPENDECTOMY    . CLOSED REDUCTION METACARPAL WITH PERCUTANEOUS PINNING Right 05/01/2017   Procedure: CLOSED REDUCTION METACARPAL WITH PERCUTANEOUS PINNING;  Surgeon: Garald Balding, MD;  Location: [facility];  Service: Orthopedics;  Laterality: Right;   Social History   Social History Narrative  . No narrative on file     Objective: Vital Signs: BP (!) 141/83   Pulse 79   Resp 14   Ht 5\' 3"  (1.6 m)   Wt 106 lb (48.1 kg)   BMI 18.78 kg/m    Physical Exam  Constitutional: She is oriented to person, place, and time. She appears well-developed and well-nourished.  HENT:  Head: Normocephalic and atraumatic.  Eyes: Conjunctivae and EOM are normal.  Neck: Normal range of motion.  Cardiovascular: Normal rate, regular rhythm, normal  heart sounds and intact distal pulses.   Pulmonary/Chest: Effort normal and breath sounds normal.  Abdominal: Soft. Bowel sounds are normal.  Lymphadenopathy:    She has no cervical adenopathy.  Neurological: She is alert and oriented to person, place, and time.  Skin: Skin is warm and dry. Capillary refill takes less than 2 seconds.  Psychiatric: She has a normal mood and affect. Her behavior  is normal.  Nursing note and vitals reviewed.    Musculoskeletal Exam: C-spine and thoracic lumbar spine good range of motion. Shoulder joints elbow joints wrist joints good range of motion. She synovitis and some of her MCP joints as described below. PIP/DIP thickening was noted due to underlying osteoarthritis. Hip joints knee joints ankles MTPs PIPs DIPs with good range of motion with no synovitis.   CDAI Exam: CDAI Homunculus Exam:   Tenderness:  Right hand: 5th MCP Left hand: 2nd MCP  Swelling:  Right hand: 5th MCP Left hand: 2nd MCP  Joint Counts:  CDAI Tender Joint count: 2 CDAI Swollen Joint count: 2  Global Assessments:  Patient Global Assessment: 4   CDAI Calculated Score: 8    Investigation: No additional findings. CBC Latest Ref Rng & Units 05/01/2017 10/12/2016  WBC 4.0 - 10.5 K/uL 5.4 5.8  Hemoglobin 12.0 - 15.0 g/dL 13.8 13.2  Hematocrit 36.0 - 46.0 % 41.2 40.4  Platelets 150 - 400 K/uL 282 305    CMP Latest Ref Rng & Units 05/01/2017 10/12/2016 08/24/2013  Glucose 65 - 99 mg/dL 88 88 70  BUN 6 - 20 mg/dL 17 16 17   Creatinine 0.44 - 1.00 mg/dL 0.90 1.03 0.89  Sodium 135 - 145 mmol/L 137 139 137  Potassium 3.5 - 5.1 mmol/L 3.9 4.3 3.5  Chloride 101 - 111 mmol/L 105 100 103  CO2 22 - 32 mmol/L 24 25 24   Calcium 8.9 - 10.3 mg/dL 9.6 9.7 9.6  Total Protein 6.1 - 8.1 g/dL - 7.3 -  Total Bilirubin 0.2 - 1.2 mg/dL - 0.4 -  Alkaline Phos 33 - 130 U/L - 27(L) -  AST 10 - 35 U/L - 24 -  ALT 6 - 29 U/L - 20 -    Imaging: No results found.  Speciality Comments: No specialty comments available.    Procedures:  No procedures performed Allergies: Felbamate; Tiagabine; and Zonisamide   Assessment / Plan:     Visit Diagnoses: Rheumatoid arthritis involving multiple sites with positive rheumatoid factor + CCP MTP Erosions ([place]): She complains of increase arthralgias. She has synovitis and some of the joints as described above. She is on low-dose Plaquenil  only. I would like to increase her Plaquenil to twice a day dosing. Which she will take Monday to Friday only. We will continue to monitor her labs and eye exams. I will give her a new prescription today.  High risk medication use - Plaquenil 200 mg po qd(treated with methotrexate intermittently-discontinued due to elevated creatinine. Discussed Enbrel in the past which patient declined)  Age-related osteoporosis without current pathological fracture - The BMD measured at Femur Total Left is 0.613 g/cm2 with a T-score of -3.1. On Fosamax 04/25/2017 DEXA ordered by Dr Coletta Memos   Primary osteoarthritis of both hands: Chronic pain  Primary osteoarthritis of both knees: Chronic pain  Primary osteoarthritis of both feet: Proper fitting shoes were discussed.  Her other medical problems are as follows  History of seizure disorder  History of anxiety  History of hypothyroidism  History of hyperlipidemia  Noncompliance -  Infrequent follow-up visits and intermittent treatment.    Orders: Orders Placed This Encounter  Procedures  . CBC with Differential/Platelet  . COMPLETE METABOLIC PANEL WITH GFR   Meds ordered this encounter  Medications  . hydroxychloroquine (PLAQUENIL) 200 MG tablet    Sig: BID Monday thru Friday    Dispense:  120 tablet    Refill:  1     Follow-Up Instructions: Return in about 5 months (around 12/26/2017) for Rheumatoid arthritis, Osteoarthritis, Osteoporosis.   Bo Merino, MD  Note - This record has been created using [employer].  Chart creation errors have been sought, but may not always  have been located. Such creation errors do not reflect on  the standard of medical care.

## 2017-07-26 ENCOUNTER — Ambulatory Visit (INDEPENDENT_AMBULATORY_CARE_PROVIDER_SITE_OTHER): Payer: Medicare HMO | Admitting: Rheumatology

## 2017-07-26 ENCOUNTER — Encounter: Payer: Self-pay | Admitting: Rheumatology

## 2017-07-26 VITALS — BP 141/83 | HR 79 | Resp 14 | Ht 63.0 in | Wt 106.0 lb

## 2017-07-26 DIAGNOSIS — M0579 Rheumatoid arthritis with rheumatoid factor of multiple sites without organ or systems involvement: Secondary | ICD-10-CM

## 2017-07-26 DIAGNOSIS — Z8669 Personal history of other diseases of the nervous system and sense organs: Secondary | ICD-10-CM | POA: Diagnosis not present

## 2017-07-26 DIAGNOSIS — M170 Bilateral primary osteoarthritis of knee: Secondary | ICD-10-CM

## 2017-07-26 DIAGNOSIS — M19072 Primary osteoarthritis, left ankle and foot: Secondary | ICD-10-CM | POA: Diagnosis not present

## 2017-07-26 DIAGNOSIS — M19042 Primary osteoarthritis, left hand: Secondary | ICD-10-CM

## 2017-07-26 DIAGNOSIS — M19071 Primary osteoarthritis, right ankle and foot: Secondary | ICD-10-CM | POA: Diagnosis not present

## 2017-07-26 DIAGNOSIS — M810 Age-related osteoporosis without current pathological fracture: Secondary | ICD-10-CM

## 2017-07-26 DIAGNOSIS — Z9119 Patient's noncompliance with other medical treatment and regimen: Secondary | ICD-10-CM | POA: Diagnosis not present

## 2017-07-26 DIAGNOSIS — Z79899 Other long term (current) drug therapy: Secondary | ICD-10-CM

## 2017-07-26 DIAGNOSIS — Z8659 Personal history of other mental and behavioral disorders: Secondary | ICD-10-CM | POA: Diagnosis not present

## 2017-07-26 DIAGNOSIS — M19041 Primary osteoarthritis, right hand: Secondary | ICD-10-CM | POA: Diagnosis not present

## 2017-07-26 DIAGNOSIS — Z91199 Patient's noncompliance with other medical treatment and regimen due to unspecified reason: Secondary | ICD-10-CM

## 2017-07-26 DIAGNOSIS — Z8639 Personal history of other endocrine, nutritional and metabolic disease: Secondary | ICD-10-CM | POA: Diagnosis not present

## 2017-07-26 LAB — CBC WITH DIFFERENTIAL/PLATELET
BASOS PCT: 1 %
Basophils Absolute: 46 cells/uL (ref 0–200)
EOS PCT: 2 %
Eosinophils Absolute: 92 cells/uL (ref 15–500)
HEMATOCRIT: 41.2 % (ref 35.0–45.0)
Hemoglobin: 13.8 g/dL (ref 11.7–15.5)
LYMPHS PCT: 30 %
Lymphs Abs: 1380 cells/uL (ref 850–3900)
MCH: 30.5 pg (ref 27.0–33.0)
MCHC: 33.5 g/dL (ref 32.0–36.0)
MCV: 91.2 fL (ref 80.0–100.0)
MONO ABS: 690 {cells}/uL (ref 200–950)
MONOS PCT: 15 %
MPV: 8.5 fL (ref 7.5–12.5)
Neutro Abs: 2392 cells/uL (ref 1500–7800)
Neutrophils Relative %: 52 %
PLATELETS: 299 10*3/uL (ref 140–400)
RBC: 4.52 MIL/uL (ref 3.80–5.10)
RDW: 13.6 % (ref 11.0–15.0)
WBC: 4.6 10*3/uL (ref 3.8–10.8)

## 2017-07-26 MED ORDER — HYDROXYCHLOROQUINE SULFATE 200 MG PO TABS: ORAL_TABLET | ORAL | 1 refills | Status: DC

## 2017-07-27 LAB — COMPLETE METABOLIC PANEL WITH GFR
ALBUMIN: 4.5 g/dL (ref 3.6–5.1)
ALK PHOS: 34 U/L (ref 33–130)
ALT: 17 U/L (ref 6–29)
AST: 23 U/L (ref 10–35)
BUN: 16 mg/dL (ref 7–25)
CALCIUM: 10.4 mg/dL (ref 8.6–10.4)
CHLORIDE: 100 mmol/L (ref 98–110)
CO2: 23 mmol/L (ref 20–32)
Creat: 0.98 mg/dL (ref 0.50–1.05)
GFR, EST AFRICAN AMERICAN: 73 mL/min (ref >=60)
GFR, EST NON AFRICAN AMERICAN: 63 mL/min (ref >=60)
Glucose, Bld: 89 mg/dL (ref 65–99)
POTASSIUM: 4.7 mmol/L (ref 3.5–5.3)
SODIUM: 137 mmol/L (ref 135–146)
Total Bilirubin: 0.4 mg/dL (ref 0.2–1.2)
Total Protein: 7.3 g/dL (ref 6.1–8.1)

## 2017-07-27 NOTE — Progress Notes (Signed)
WNL

## 2017-08-01 ENCOUNTER — Telehealth: Payer: Self-pay | Admitting: Rheumatology

## 2017-08-01 NOTE — Telephone Encounter (Signed)
Patient advised she is to take her PLQ BID Monday through Friday

## 2017-08-01 NOTE — Telephone Encounter (Signed)
Patient left a message stating she received her rx for Generic Plaquenil, and directions state take 1 BID M-F. Patient has been taking 1QD. Please call to confirm directions she is to follow.

## 2017-08-03 ENCOUNTER — Telehealth: Payer: Self-pay | Admitting: Rheumatology

## 2017-08-03 NOTE — Telephone Encounter (Signed)
Spoke with patient's husband Gerald Stabs. Advised of recommendations. Patient's husband states he will speak with his wife and will make sure of what the whole story is with her medication and symptoms are to clarify everything. He states she gets confused. He will call back on Tuesday.

## 2017-08-03 NOTE — Telephone Encounter (Signed)
Please call patient about Rx for generic Plaquenil.  Patient experiencing light headiness, balance issues, patient feels drunk feeling almost that lasts most of the day. Patient is fine in pm. Please call patient to advise.

## 2017-08-03 NOTE — Telephone Encounter (Signed)
Patient may discontinue Plaquenil and see if her symptoms improve if not then she should follow up with her PCP is better. I doubt that her symptoms are related to Plaquenil she's taken it for multiple years.

## 2017-08-03 NOTE — Telephone Encounter (Signed)
Patient states she is having some side effects on medications and she is not sure if it is the PLQ or other medication she is taking.. Patient states she gets light headed and balance issues. Patient denies any falls. Patient states she is taking Fosamax 10 mg daily, Lamictal 250 mg, PLQ 200 mg, Hair skin and nails vitamin and Calcium. Patient states the light headedness subsides if she lays down.  Patient would like to know if the PLQ may be causing these side effects. Please advise.

## 2017-10-09 ENCOUNTER — Emergency Department (HOSPITAL_COMMUNITY): Payer: Medicare HMO

## 2017-10-09 ENCOUNTER — Emergency Department (HOSPITAL_COMMUNITY)
Admission: EM | Admit: 2017-10-09 | Discharge: 2017-10-09 | Disposition: A | Discharge status: Home / Self Care | Payer: Medicare HMO | Attending: Physician Assistant | Admitting: Physician Assistant

## 2017-10-09 ENCOUNTER — Encounter (HOSPITAL_COMMUNITY): Payer: Self-pay

## 2017-10-09 DIAGNOSIS — Z23 Encounter for immunization: Secondary | ICD-10-CM | POA: Insufficient documentation

## 2017-10-09 DIAGNOSIS — E039 Hypothyroidism, unspecified: Secondary | ICD-10-CM | POA: Insufficient documentation

## 2017-10-09 DIAGNOSIS — Z79899 Other long term (current) drug therapy: Secondary | ICD-10-CM | POA: Diagnosis not present

## 2017-10-09 DIAGNOSIS — R569 Unspecified convulsions: Secondary | ICD-10-CM

## 2017-10-09 MED ORDER — TETANUS-DIPHTH-ACELL PERTUSSIS 5-2.5-18.5 LF-MCG/0.5 IM SUSP
0.5000 mL | Freq: Once | INTRAMUSCULAR | Status: AC
  Administered 2017-10-09: 0.5 mL via INTRAMUSCULAR
  Filled 2017-10-09: qty 0.5
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
## 2017-10-09 NOTE — ED Provider Notes (Signed)
[facility] DEPT Provider Note   CSN: [id number] Arrival date & time: 10/09/17  2102     History   Chief Complaint Chief Complaint  Patient presents with  . Seizures  . Head Injury    HPI ASANI DENISTON is a 61 y.o. female.  HPI  Patient is a 61 year old female presenting with seizure.  Patient had epilepsy since she was a teenager.  She is on both Lamictal and Keppra.  Managed by [facility].  Patient has breakthrough seizures 2-3 months.  Patient had one tonight.  Patient was brought in by husband because she hit her heart head against the dishwasher.  Patient's husband which is concerned about how hard she hit her head, not concerned about any infection preceding this or lowering her seizure threshold.  Past Medical History:  Diagnosis Date  . Hypothyroidism   . Rheumatoid arthritis, adult ([place])   . Seizures ([place])    epilepsy since teenager.   . Thyroid disease     Patient Active Problem List   Diagnosis Date Noted  . Primary osteoarthritis of both feet 07/24/2017  . Primary osteoarthritis of both knees 07/24/2017  . Fracture of metacarpal neck of right hand, closed 05/01/2017  . High risk medication use 03/15/2017  . Primary osteoarthritis of both hands 03/15/2017  . Pain in right hand 03/15/2017  . Age-related osteoporosis without current pathological fracture 03/15/2017  . 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 APPENDECTOMY      OB History    No data available       Home Medications    Prior to Admission medications   Medication Sig Start Date End Date Taking? Authorizing Provider  alendronate (FOSAMAX) 10 MG tablet Take 10 mg by mouth daily before breakfast. Take with a full glass of water on an empty stomach.    [provider]  BIOTIN PO Take 3,000 mcg by mouth daily.     [provider]  Black Cohosh 40 MG CAPS Take 40 mg by mouth daily with supper.     [provider]  Calcium Carbonate (CALCIUM 600 PO) Take 600 mg by mouth daily.    [provider]  Cholecalciferol (VITAMIN D) 2000 UNITS tablet Take 2,000 Units by mouth daily with supper.     [provider]  fluticasone (FLONASE) 50 MCG/ACT nasal spray Place 1-2 sprays into the nose at bedtime.     [provider]  folic acid (FOLVITE) 1 MG tablet Take 1 mg by mouth daily with lunch.    [provider]  hydroxychloroquine (PLAQUENIL) 200 MG tablet BID Monday thru Friday 07/26/17   Bo Merino, MD  ibuprofen (ADVIL,MOTRIN) 600 MG tablet Take 1 tablet (600 mg total) by mouth every 8 (eight) hours as needed. Patient not taking: Reported on 05/28/2017 04/17/17   Paulette Blanch, MD  lamoTRIgine (LAMICTAL) 100 MG tablet Take 250 mg by mouth 2 (two) times daily.     [provider]  levothyroxine (SYNTHROID, LEVOTHROID) 75 MCG tablet Take 75 mcg by mouth daily before supper.     [provider]  Misc Natural Products (ESTROVEN ENERGY PO) Take 1 tablet by mouth daily with supper.     [provider]  Multiple Vitamins-Minerals (CENTRUM SILVER ADULT 50+) TABS Take 1 tablet by mouth daily with supper.     [provider]  OXcarbazepine (TRILEPTAL) 150 MG tablet Take 300 mg by mouth 2 (two) times daily.  03/07/17   [provider]  simvastatin (ZOCOR) 20 MG tablet Take 20 mg by mouth daily with lunch.     [provider]  Trolamine Salicylate (ASPERCREME EX) Apply 1 application topically daily as needed (muscle pain).    [provider]    Family History Family History  Problem Relation Age of Onset  . Breast cancer Mother   . Colon cancer Father 59    Social History Social History   Tobacco Use  . Smoking status: Never Smoker  . Smokeless tobacco: Never Used  Substance Use Topics  . Alcohol  use: No  . Drug use: No     Allergies   Felbamate; Tiagabine; and Zonisamide   Review of Systems Review of Systems  Constitutional: Negative for activity change.  Respiratory: Negative for shortness of breath.   Cardiovascular: Negative for chest pain.  Gastrointestinal: Negative for abdominal pain.  Neurological: Positive for seizures.     Physical Exam Updated Vital Signs BP (!) 156/94 (BP Location: Right Arm)   Pulse (!) 103   Temp 98.2 F (36.8 C) (Oral)   Resp 18   SpO2 100%   Physical Exam  Constitutional: She is oriented to person, place, and time. She appears well-developed and well-nourished.  HENT:  Head: Normocephalic and atraumatic.  Abrasion to the right side of her nose.  Mild erythema to the right ear.  Abrasion overlying hematoma to the right forehead.  Eyes: Right eye exhibits no discharge. Left eye exhibits no discharge.  Cardiovascular: Normal rate, regular rhythm and normal heart sounds.  No murmur heard. Pulmonary/Chest: Effort normal and breath sounds normal. She has no wheezes. She has no rales.  Abdominal: Soft. She exhibits no distension. There is no tenderness.  Neurological: She is oriented to person, place, and time. No cranial nerve deficit.  Skin: Skin is warm and dry. She is not diaphoretic.  Psychiatric: She has a normal mood and affect.  Nursing note and vitals reviewed.    ED Treatments / Results  Labs (all labs ordered are listed, but only abnormal results are displayed) Labs Reviewed - No data to display  EKG  EKG Interpretation None       Radiology No results found.  Procedures Procedures (including critical care time)  Medications Ordered in ED Medications - No data to display   Initial Impression / Assessment and Plan / ED Course  I have reviewed the triage vital signs and the nursing notes.  Pertinent labs & imaging results that were available during my care of the patient were reviewed by me and considered  in my medical decision making (see chart for details).     Patient is a 61 year old female presenting with seizure.  Patient had epilepsy since she was a teenager.  She is on both Lamictal and Keppra.  Managed by [facility].  Patient has breakthrough seizures 2-3 months.  Patient had one tonight.  Patient was brought in by husband because she hit her heart head against the dishwasher.  Patient's husband which is concerned about how hard she hit her head, not concerned about any infection preceding this or lowering her seizure threshold.  10:32 PM Discussed the utility of lab work in searching for infection to lower seizure special.  Both patient and husband think this is another oneof her breakthrough seizures.  Husband concerned about intracranial injury given  that she hit her head.  Does not want labwork. Will get CT head and neck, update tetanus.  Final Clinical Impressions(s) / ED Diagnoses   Final diagnoses:  None    ED Discharge Orders    None       Macarthur Critchley, MD 10/10/17 2337

## 2017-10-09 NOTE — Discharge Instructions (Signed)
You are seen today after had having a seizure and hitting her head.  Your CT and CT spine were reassuring. Please return as needed

## 2017-12-18 NOTE — Progress Notes (Signed)
Office Visit Note  Patient: Melinda Sparks             Date of Birth: [birth year]           MRN: [id number]             PCP: Bernerd Limbo, MD Referring: Bernerd Limbo, MD Visit Date: 01/01/2018 Occupation: @GUAROCC @    Subjective:  Hand pain   History of Present Illness: Melinda Sparks is a 62 y.o. female with history of seropositive rheumatoid arthritis, osteoarthritis, and osteoporosis.  Patient state she has been taking PLQ 200 mg BID Monday-Friday.  She denies needing any refills.  She states that she has intermittent right hand and wrist pain.  She notices occasional swelling.  She denies any knee or feet pain at this time.  She continues to take Fosamax for osteoporosis.     Activities of Daily Living:  Patient reports morning stiffness for 0 minutes.   Patient Denies nocturnal pain.  Difficulty dressing/grooming: Denies Difficulty climbing stairs: Denies Difficulty getting out of chair: Denies Difficulty using hands for taps, buttons, cutlery, and/or writing: Denies   Review of Systems  Constitutional: Positive for night sweats. Negative for fatigue and weakness.  HENT: Negative for mouth sores, mouth dryness and nose dryness.   Eyes: Negative for redness, visual disturbance and dryness.  Respiratory: Negative for cough and shortness of breath.   Cardiovascular: Negative for chest pain, palpitations, hypertension and swelling in legs/feet.  Gastrointestinal: Negative for blood in stool, constipation and diarrhea.  Endocrine: Negative for increased urination.  Genitourinary: Negative for painful urination.  Musculoskeletal: Positive for arthralgias, joint pain and joint swelling. Negative for myalgias, muscle weakness, morning stiffness, muscle tenderness and myalgias.  Skin: Negative for color change, pallor, rash, hair loss, nodules/bumps, redness, skin tightness, ulcers and sensitivity to sunlight.  Neurological: Positive for memory loss and night sweats. Negative  for dizziness, numbness and headaches.  Hematological: Positive for bruising/bleeding tendency. Negative for swollen glands.  Psychiatric/Behavioral: Positive for confusion. Negative for depressed mood and sleep disturbance.    PMFS History:  Patient Active Problem List   Diagnosis Date Noted  . Primary osteoarthritis of both feet 07/24/2017  . Primary osteoarthritis of both knees 07/24/2017  . Fracture of metacarpal neck of right hand, closed 05/01/2017  . High risk medication use 03/15/2017  . Primary osteoarthritis of both hands 03/15/2017  . Pain in right hand 03/15/2017  . Age-related osteoporosis without current pathological fracture 03/15/2017  . 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 Hypothyroidism   . Rheumatoid arthritis, adult ([place])   . Seizures ([place])    epilepsy since teenager.   . Thyroid disease     Family History  Problem Relation Age of Onset  . Breast cancer Mother   . Colon cancer Father 56   Past Surgical History:  Procedure Laterality Date  . APPENDECTOMY    . CLOSED REDUCTION METACARPAL WITH PERCUTANEOUS PINNING Right 05/01/2017   Procedure: CLOSED REDUCTION METACARPAL WITH PERCUTANEOUS PINNING;  Surgeon: Garald Balding, MD;  Location: [facility];  Service: Orthopedics;  Laterality: Right;   Social History   Social History Narrative  . Not on file     Objective: Vital Signs: BP 124/77 (BP Location: Left Arm, Patient Position: Sitting, Cuff Size: Normal)   Pulse 80   Resp 14   Ht 5\' 3"  (1.6 m)   Wt  108 lb (49 kg)   BMI 19.13 kg/m    Physical Exam  Constitutional: She is oriented to person, place, and time. She appears well-developed and well-nourished.  HENT:  Head: Normocephalic and atraumatic.  Eyes: Conjunctivae and EOM are normal.  Neck: Normal range of motion.  Cardiovascular: Normal rate, regular rhythm, normal heart  sounds and intact distal pulses.  Pulmonary/Chest: Effort normal and breath sounds normal.  Abdominal: Soft. Bowel sounds are normal.  Lymphadenopathy:    She has no cervical adenopathy.  Neurological: She is alert and oriented to person, place, and time.  Skin: Skin is warm and dry. Capillary refill takes less than 2 seconds.  Psychiatric: She has a normal mood and affect. Her behavior is normal.  Nursing note and vitals reviewed.    Musculoskeletal Exam: C-spine, thoracic, and lumbar spine good ROM.  Shoulder joints, elbow joints, wrist joints, MCPs, PIPs, and DIPs good ROM with no synovitis. She has PIP and DIP synovial thickening consistent with osteoarthritis. Hip joints, knee joints, ankle joints, MTPs, PIPs, and DIPs good ROM with no synovitis.   She has no warmth or effusion of knees.  No knee crepitus. She has no midline spinal tenderness.  No SI joint tenderness.  No trochanteric bursitis.    CDAI Exam: CDAI Homunculus Exam:   Joint Counts:  CDAI Tender Joint count: 0 CDAI Swollen Joint count: 0  Global Assessments:  Patient Global Assessment: 2 Provider Global Assessment: 2  CDAI Calculated Score: 4    Investigation: No additional findings.PLQ eye exam: 11/06/2017 CBC Latest Ref Rng & Units 07/26/2017 05/01/2017 10/12/2016  WBC 3.8 - 10.8 K/uL 4.6 5.4 5.8  Hemoglobin 11.7 - 15.5 g/dL 13.8 13.8 13.2  Hematocrit 35.0 - 45.0 % 41.2 41.2 40.4  Platelets 140 - 400 K/uL 299 282 305   CMP Latest Ref Rng & Units 07/26/2017 05/01/2017 10/12/2016  Glucose 65 - 99 mg/dL 89 88 88  BUN 7 - 25 mg/dL 16 17 16   Creatinine 0.50 - 1.05 mg/dL 0.98 0.90 1.03  Sodium 135 - 146 mmol/L 137 137 139  Potassium 3.5 - 5.3 mmol/L 4.7 3.9 4.3  Chloride 98 - 110 mmol/L 100 105 100  CO2 20 - 32 mmol/L 23 24 25   Calcium 8.6 - 10.4 mg/dL 10.4 9.6 9.7  Total Protein 6.1 - 8.1 g/dL 7.3 - 7.3  Total Bilirubin 0.2 - 1.2 mg/dL 0.4 - 0.4  Alkaline Phos 33 - 130 U/L 34 - 27(L)  AST 10 - 35 U/L 23 - 24    ALT 6 - 29 U/L 17 - 20    Imaging: No results found.  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 Discussed Enbrel in the past which patient declined)eye exam: 11/06/2017 - Plan: CBC with Differential/Platelet, COMPLETE METABOLIC PANEL WITH GFR  Primary osteoarthritis of both hands  Primary osteoarthritis of both knees  Primary osteoarthritis of both feet  Age-related osteoporosis without current pathological fracture - The BMD measured at Femur Total Left is 0.613 g/cm2 with a T-score of -3.1. On Fosamax 04/25/2017 DEXA ordered by Dr Coletta Memos   History of seizure disorder  History of hyperlipidemia  History of anxiety  History of hypothyroidism  Noncompliance -  Infrequent follow-up visits and intermittent treatment.     Orders: Orders Placed This Encounter  Procedures  . CBC with Differential/Platelet  . COMPLETE METABOLIC PANEL WITH GFR   No orders of the defined types were placed in this encounter.     Follow-Up Instructions: Return in about 5 months (around 06/01/2018) for Rheumatoid arthritis, Osteoarthritis.   Bo Merino, MD  Note - This record has been created using Editor, commissioning.  Chart creation errors have been sought, but may not always  have been located. Such creation errors do not reflect on  the standard of medical care.

## 2018-01-01 ENCOUNTER — Ambulatory Visit: Payer: Medicare HMO | Admitting: Rheumatology

## 2018-01-01 ENCOUNTER — Encounter: Payer: Self-pay | Admitting: Rheumatology

## 2018-01-01 VITALS — BP 124/77 | HR 80 | Resp 14 | Ht 63.0 in | Wt 108.0 lb

## 2018-01-01 DIAGNOSIS — M170 Bilateral primary osteoarthritis of knee: Secondary | ICD-10-CM

## 2018-01-01 DIAGNOSIS — Z9119 Patient's noncompliance with other medical treatment and regimen: Secondary | ICD-10-CM | POA: Diagnosis not present

## 2018-01-01 DIAGNOSIS — M810 Age-related osteoporosis without current pathological fracture: Secondary | ICD-10-CM | POA: Diagnosis not present

## 2018-01-01 DIAGNOSIS — Z8659 Personal history of other mental and behavioral disorders: Secondary | ICD-10-CM | POA: Diagnosis not present

## 2018-01-01 DIAGNOSIS — Z8669 Personal history of other diseases of the nervous system and sense organs: Secondary | ICD-10-CM

## 2018-01-01 DIAGNOSIS — M19041 Primary osteoarthritis, right hand: Secondary | ICD-10-CM

## 2018-01-01 DIAGNOSIS — Z8639 Personal history of other endocrine, nutritional and metabolic disease: Secondary | ICD-10-CM | POA: Diagnosis not present

## 2018-01-01 DIAGNOSIS — M0579 Rheumatoid arthritis with rheumatoid factor of multiple sites without organ or systems involvement: Secondary | ICD-10-CM | POA: Diagnosis not present

## 2018-01-01 DIAGNOSIS — M19071 Primary osteoarthritis, right ankle and foot: Secondary | ICD-10-CM | POA: Diagnosis not present

## 2018-01-01 DIAGNOSIS — Z79899 Other long term (current) drug therapy: Secondary | ICD-10-CM | POA: Diagnosis not present

## 2018-01-01 DIAGNOSIS — M19072 Primary osteoarthritis, left ankle and foot: Secondary | ICD-10-CM | POA: Diagnosis not present

## 2018-01-01 DIAGNOSIS — M19042 Primary osteoarthritis, left hand: Secondary | ICD-10-CM

## 2018-01-01 DIAGNOSIS — Z91199 Patient's noncompliance with other medical treatment and regimen due to unspecified reason: Secondary | ICD-10-CM

## 2018-01-01 NOTE — Patient Instructions (Signed)

## 2018-01-02 NOTE — Progress Notes (Signed)
Please add phosphorus.  Her alk phos is slightly low.   All other labs are WNL.

## 2018-01-03 LAB — TEST AUTHORIZATION

## 2018-01-03 LAB — CBC WITH DIFFERENTIAL/PLATELET
BASOS PCT: 0.9 %
Basophils Absolute: 40 cells/uL (ref 0–200)
EOS ABS: 48 {cells}/uL (ref 15–500)
Eosinophils Relative: 1.1 %
HEMATOCRIT: 38.7 % (ref 35.0–45.0)
Hemoglobin: 13.1 g/dL (ref 11.7–15.5)
LYMPHS ABS: 1192 {cells}/uL (ref 850–3900)
MCH: 29.8 pg (ref 27.0–33.0)
MCHC: 33.9 g/dL (ref 32.0–36.0)
MCV: 88.2 fL (ref 80.0–100.0)
MPV: 9.1 fL (ref 7.5–12.5)
Monocytes Relative: 14.7 %
NEUTROS PCT: 56.2 %
Neutro Abs: 2473 cells/uL (ref 1500–7800)
PLATELETS: 290 10*3/uL (ref 140–400)
RBC: 4.39 10*6/uL (ref 3.80–5.10)
RDW: 12.1 % (ref 11.0–15.0)
TOTAL LYMPHOCYTE: 27.1 %
WBC: 4.4 10*3/uL (ref 3.8–10.8)
WBCMIX: 647 {cells}/uL (ref 200–950)

## 2018-01-03 LAB — COMPLETE METABOLIC PANEL WITH GFR
AG RATIO: 1.7 (calc) (ref 1.0–2.5)
ALT: 18 U/L (ref 6–29)
AST: 21 U/L (ref 10–35)
Albumin: 4.3 g/dL (ref 3.6–5.1)
Alkaline phosphatase (APISO): 28 U/L — ABNORMAL LOW (ref 33–130)
BILIRUBIN TOTAL: 0.4 mg/dL (ref 0.2–1.2)
BUN: 17 mg/dL (ref 7–25)
CALCIUM: 10.3 mg/dL (ref 8.6–10.4)
CHLORIDE: 104 mmol/L (ref 98–110)
CO2: 30 mmol/L (ref 20–32)
Creat: 0.93 mg/dL (ref 0.50–0.99)
GFR, EST AFRICAN AMERICAN: 77 mL/min/{1.73_m2} (ref >=60)
GFR, EST NON AFRICAN AMERICAN: 67 mL/min/{1.73_m2} (ref >=60)
GLOBULIN: 2.6 g/dL (ref 1.9–3.7)
Glucose, Bld: 86 mg/dL (ref 65–99)
POTASSIUM: 5.2 mmol/L (ref 3.5–5.3)
SODIUM: 138 mmol/L (ref 135–146)
TOTAL PROTEIN: 6.9 g/dL (ref 6.1–8.1)

## 2018-01-03 LAB — PHOSPHORUS: Phosphorus: 4.2 mg/dL (ref 2.5–4.5)

## 2018-01-03 NOTE — Progress Notes (Signed)
Phosphorus WNL.

## 2018-02-25 ENCOUNTER — Other Ambulatory Visit: Payer: Self-pay | Admitting: Rheumatology

## 2018-02-26 NOTE — Telephone Encounter (Signed)
Last Visit: 01/01/18 Next Visit: 05/29/18 Labs: 01/01/18 Her alk phos is slightly low.  All other labs are WNL. Phosphorus WNL PLQ Eye Exam: 11/06/17 WNL  Okay to refill per Dr. Estanislado Pandy

## 2018-03-25 ENCOUNTER — Other Ambulatory Visit: Payer: Self-pay | Admitting: Family Medicine

## 2018-03-25 DIAGNOSIS — Z1231 Encounter for screening mammogram for malignant neoplasm of breast: Secondary | ICD-10-CM

## 2018-04-26 ENCOUNTER — Ambulatory Visit
Admission: RE | Admit: 2018-04-26 | Discharge: 2018-04-26 | Disposition: A | Discharge status: Home / Self Care | Payer: Medicare HMO | Source: Ambulatory Visit | Attending: Family Medicine | Admitting: Family Medicine

## 2018-04-26 DIAGNOSIS — Z1231 Encounter for screening mammogram for malignant neoplasm of breast: Secondary | ICD-10-CM

## 2018-05-20 NOTE — Progress Notes (Signed)
Office Visit Note  Patient: Melinda Sparks             Date of Birth: Sep 15, 1956           MRN: [id number]             PCP: Bernerd Limbo, MD Referring: Bernerd Limbo, MD Visit Date: 05/29/2018 Occupation: @GUAROCC @    Subjective:  Pain in multiple joints   History of Present Illness: MALEIAH DULA is a 62 y.o. female with history of seropositive rheumatoid arthritis, osteoarthritis, and osteoporosis.  Patient is on Plaquenil 200 mg 1 tablet twice daily Monday through Friday.  She denies missing any doses of Plaquenil.  She denies any pain in her hands at this time but states she occasionally has some swelling and is difficult to get her rings off.  She states she has morning stiffness for about 5 minutes in the morning.  She states she is been having some discomfort of the left elbow but denies any joint swelling.  She denies any pain or swelling in her bilateral feet.  She states that she will occasionally have some discomfort in her bilateral ankles especially with range of motion.  She states she also has popping in her ankles.  She states she has been having muscle aches and muscle tenderness in the trapezius region on the left side as well as some left-sided muscle aches.  She denies any muscle spasms.  She denies using heating pad or massage.  She denies any overuse activities or injuries.  She reports her knees are doing well.       Activities of Daily Living:  Patient reports morning stiffness for 5 minutes.   Patient Denies nocturnal pain.  Difficulty dressing/grooming: Reports Difficulty climbing stairs: Reports Difficulty getting out of chair: Denies Difficulty using hands for taps, buttons, cutlery, and/or writing: Reports   Review of Systems  Constitutional: Positive for fatigue. Negative for fever.  HENT: Negative for ear pain, mouth sores, mouth dryness and nose dryness.   Eyes: Negative for pain, visual disturbance and dryness.  Respiratory: Negative for cough,  hemoptysis, shortness of breath and difficulty breathing.   Cardiovascular: Positive for swelling in legs/feet. Negative for chest pain, palpitations and hypertension.  Gastrointestinal: Negative for blood in stool, constipation and diarrhea.  Endocrine: Negative for increased urination.  Genitourinary: Negative for difficulty urinating and painful urination.  Musculoskeletal: Positive for arthralgias, joint pain, myalgias, muscle weakness and myalgias. Negative for joint swelling, morning stiffness and muscle tenderness.  Skin: Positive for sensitivity to sunlight. Negative for color change, pallor, rash, hair loss, nodules/bumps, skin tightness and ulcers.  Allergic/Immunologic: Negative for susceptible to infections.  Neurological: Negative for dizziness, numbness and headaches.  Hematological: Negative for swollen glands.  Psychiatric/Behavioral: Negative for depressed mood and sleep disturbance. The patient is not nervous/anxious.     PMFS History:  Patient Active Problem List   Diagnosis Date Noted  . Primary osteoarthritis of both feet 07/24/2017  . Primary osteoarthritis of both knees 07/24/2017  . Fracture of metacarpal neck of right hand, closed 05/01/2017  . High risk medication use 03/15/2017  . Primary osteoarthritis of both hands 03/15/2017  . Pain in right hand 03/15/2017  . Age-related osteoporosis without current pathological fracture 03/15/2017  . Rheumatoid arthritis involving multiple sites with positive rheumatoid factor + CCP MTP Erosions ([place]) 11/13/2016    Past Medical History:  Diagnosis Date  . Hypothyroidism   . Rheumatoid arthritis, adult ([place])   . Seizures ([place])  epilepsy since teenager.   . Thyroid disease     Family History  Problem Relation Age of Onset  . Breast cancer Mother   . Colon cancer Father 24   Past Surgical History:  Procedure Laterality Date  . APPENDECTOMY    . CLOSED REDUCTION METACARPAL WITH PERCUTANEOUS PINNING Right  05/01/2017   Procedure: CLOSED REDUCTION METACARPAL WITH PERCUTANEOUS PINNING;  Surgeon: Garald Balding, MD;  Location: [facility];  Service: Orthopedics;  Laterality: Right;   Social History   Social History Narrative  . Not on file     Objective: Vital Signs: BP 111/74 (BP Location: Left Arm, Patient Position: Sitting, Cuff Size: Normal)   Pulse 80   Ht 5\' 5"  (1.651 m)   Wt 107 lb (48.5 kg)   BMI 17.81 kg/m    Physical Exam  Constitutional: She is oriented to person, place, and time. She appears well-developed and well-nourished.  HENT:  Head: Normocephalic and atraumatic.  Eyes: Conjunctivae and EOM are normal.  Neck: Normal range of motion.  Cardiovascular: Normal rate, regular rhythm, normal heart sounds and intact distal pulses.  Pulmonary/Chest: Effort normal and breath sounds normal.  Abdominal: Soft. Bowel sounds are normal.  Lymphadenopathy:    She has no cervical adenopathy.  Neurological: She is alert and oriented to person, place, and time.  Skin: Skin is warm and dry. Capillary refill takes less than 2 seconds.  Psychiatric: She has a normal mood and affect. Her behavior is normal.  Nursing note and vitals reviewed.    Musculoskeletal Exam: C-spine, thoracic spine, and lumbar spine good ROM.  No midline spinal tenderness.  No SI joint tenderness.  Tenderness of left paraspinal muscles in the lumbar region. Left trapezius muscle tension and muscle tenderness.  Shoulder joints, elbow joints, wrist joints, MCPs, PIPs, DIPs good range of motion with no synovitis.  She is complete fist formation bilaterally.  She has PIP and DIP synovial thickening consistent with osteoarthritis of bilateral hands.  She has tenderness in the left epicondylar region. Hip joints, knee joints, ankle joints, MTPs, PIPs, DIPs good range of motion with no synovitis.  No warmth or effusion of bilateral knee joints.  She has bilateral knee crepitus.  She has limited inversion and eversion of  bilateral ankles.  No tenderness along the ankle joint line.  She has PIP and DIP synovial thickening consistent with osteoarthritis of bilateral feet.  No tenderness of trochanteric bursa bilaterally.  CDAI Exam: CDAI Homunculus Exam:   Joint Counts:  CDAI Tender Joint count: 0 CDAI Swollen Joint count: 0  Global Assessments:  Patient Global Assessment: 5 Provider Global Assessment: 5  CDAI Calculated Score: 10    Investigation: No additional findings. CBC Latest Ref Rng & Units 01/01/2018 07/26/2017 05/01/2017  WBC 3.8 - 10.8 Thousand/uL 4.4 4.6 5.4  Hemoglobin 11.7 - 15.5 g/dL 13.1 13.8 13.8  Hematocrit 35.0 - 45.0 % 38.7 41.2 41.2  Platelets 140 - 400 Thousand/uL 290 299 282   CMP Latest Ref Rng & Units 01/01/2018 07/26/2017 05/01/2017  Glucose 65 - 99 mg/dL 86 89 88  BUN 7 - 25 mg/dL 17 16 17   Creatinine 0.50 - 0.99 mg/dL 0.93 0.98 0.90  Sodium 135 - 146 mmol/L 138 137 137  Potassium 3.5 - 5.3 mmol/L 5.2 4.7 3.9  Chloride 98 - 110 mmol/L 104 100 105  CO2 20 - 32 mmol/L 30 23 24   Calcium 8.6 - 10.4 mg/dL 10.3 10.4 9.6  Total Protein 6.1 - 8.1 g/dL 6.9  7.3 -  Total Bilirubin 0.2 - 1.2 mg/dL 0.4 0.4 -  Alkaline Phos 33 - 130 U/L - 34 -  AST 10 - 35 U/L 21 23 -  ALT 6 - 29 U/L 18 17 -     Imaging: No results found.  Speciality Comments: PLQ Eye Exam: 11/06/17 WNL    Procedures:  No procedures performed Allergies: Felbamate; Tiagabine; and Zonisamide   Assessment / Plan:     Visit Diagnoses: Rheumatoid arthritis involving multiple sites with positive rheumatoid factor + CCP MTP Erosions ([place]): She has no active synovitis on exam.  No tenderness of MCPs or MTPs on exam.  She denies any recent rheumatoid arthritis flares.  She is having some discomfort in bilateral ankles with range of motion.  She has no synovitis on exam.  She is no tenderness along the ankle joint line.  She has good range of motion with no discomfort of her C-spine.  She is having left-sided trapezius  muscle tension muscle tenderness unrelated to rheumatoid arthritis.  She will continue taking Plaquenil 200 mg by mouth Monday through Friday.  She does not need any refills today.  High risk medication use -  PLQ (treated with methotrexate intermittently-discontinued due to elevated creatinine. Discussed Enbrel in the past which patient declined)eye exam: 11/06/2017: CBC and CMP will be drawn today to monitor for drug toxicity.- Plan: CBC with Differential/Platelet, COMPLETE METABOLIC PANEL WITH GFR  Primary osteoarthritis of both hands: She has PIP and DIP synovial thickening consistent with osteoarthritis of bilateral hands.  No synovitis was noted.  Joint protection and muscle strengthening were discussed.  Primary osteoarthritis of both knees: No warmth or effusion.  She has bilateral knee crepitus.  She has no discomfort in her knees at this time.  Primary osteoarthritis of both feet: She has PIP and DIP synovial thickening consistent with osteoarthritis of bilateral feet.  The importance of wearing proper fitting shoes was discussed.  She has been having some discomfort in her bilateral ankle joints especially with range of motion.  She was given a handout of ankle joint exercises that she can work on to improve stability.  Age-related osteoporosis without current pathological fracture - The BMD measured at [facility] is 0.613 g/cm2 with a T-score of -3.1. On Fosamax 04/25/2017 DEXA ordered by Dr Coletta Memos   Lateral epicondylitis, left elbow: She has tenderness in the left lateral epicondylar region.  We discussed treatment options.  I provided exercises that she can perform at home.  I also discussed trying wearing a tennis elbow brace.  If she fails conservative measures we will try doing a cortisone injection in the future.  We also discussed physical therapy in the future.  Other medical conditions are listed as follows:   History of seizure disorder  History of  hyperlipidemia  History of anxiety  History of hypothyroidism    Orders: Orders Placed This Encounter  Procedures  . CBC with Differential/Platelet  . COMPLETE METABOLIC PANEL WITH GFR   No orders of the defined types were placed in this encounter.   Face-to-face time spent with patient was 30 minutes. >50% of time was spent in counseling and coordination of care.  Follow-Up Instructions: Return in about 5 months (around 10/29/2018) for Rheumatoid arthritis, Osteoarthritis, Osteoporosis.   Ofilia Neas, PA-C  Note - This record has been created using Dragon software.  Chart creation errors have been sought, but may not always  have been located. Such creation errors do not reflect on  the standard of medical care. 

## 2018-05-29 ENCOUNTER — Ambulatory Visit: Payer: Medicare HMO | Admitting: Physician Assistant

## 2018-05-29 ENCOUNTER — Encounter: Payer: Self-pay | Admitting: Physician Assistant

## 2018-05-29 VITALS — BP 111/74 | HR 80 | Ht 65.0 in | Wt 107.0 lb

## 2018-05-29 DIAGNOSIS — Z79899 Other long term (current) drug therapy: Secondary | ICD-10-CM | POA: Diagnosis not present

## 2018-05-29 DIAGNOSIS — Z8669 Personal history of other diseases of the nervous system and sense organs: Secondary | ICD-10-CM | POA: Diagnosis not present

## 2018-05-29 DIAGNOSIS — M19042 Primary osteoarthritis, left hand: Secondary | ICD-10-CM | POA: Diagnosis not present

## 2018-05-29 DIAGNOSIS — M810 Age-related osteoporosis without current pathological fracture: Secondary | ICD-10-CM | POA: Diagnosis not present

## 2018-05-29 DIAGNOSIS — Z8659 Personal history of other mental and behavioral disorders: Secondary | ICD-10-CM | POA: Diagnosis not present

## 2018-05-29 DIAGNOSIS — M7712 Lateral epicondylitis, left elbow: Secondary | ICD-10-CM | POA: Diagnosis not present

## 2018-05-29 DIAGNOSIS — M0579 Rheumatoid arthritis with rheumatoid factor of multiple sites without organ or systems involvement: Secondary | ICD-10-CM

## 2018-05-29 DIAGNOSIS — M19072 Primary osteoarthritis, left ankle and foot: Secondary | ICD-10-CM

## 2018-05-29 DIAGNOSIS — M19041 Primary osteoarthritis, right hand: Secondary | ICD-10-CM | POA: Diagnosis not present

## 2018-05-29 DIAGNOSIS — Z8639 Personal history of other endocrine, nutritional and metabolic disease: Secondary | ICD-10-CM | POA: Diagnosis not present

## 2018-05-29 DIAGNOSIS — M170 Bilateral primary osteoarthritis of knee: Secondary | ICD-10-CM | POA: Diagnosis not present

## 2018-05-29 DIAGNOSIS — M19071 Primary osteoarthritis, right ankle and foot: Secondary | ICD-10-CM

## 2018-05-29 LAB — CBC WITH DIFFERENTIAL/PLATELET
BASOS PCT: 0.9 %
Basophils Absolute: 39 cells/uL (ref 0–200)
EOS ABS: 52 {cells}/uL (ref 15–500)
Eosinophils Relative: 1.2 %
HEMATOCRIT: 39.3 % (ref 35.0–45.0)
HEMOGLOBIN: 13.2 g/dL (ref 11.7–15.5)
Lymphs Abs: 1054 cells/uL (ref 850–3900)
MCH: 30.6 pg (ref 27.0–33.0)
MCHC: 33.6 g/dL (ref 32.0–36.0)
MCV: 91.2 fL (ref 80.0–100.0)
MPV: 8.9 fL (ref 7.5–12.5)
Monocytes Relative: 15.5 %
Neutro Abs: 2490 cells/uL (ref 1500–7800)
Neutrophils Relative %: 57.9 %
Platelets: 292 10*3/uL (ref 140–400)
RBC: 4.31 10*6/uL (ref 3.80–5.10)
RDW: 12.1 % (ref 11.0–15.0)
Total Lymphocyte: 24.5 %
WBC: 4.3 10*3/uL (ref 3.8–10.8)
WBCMIX: 667 {cells}/uL (ref 200–950)

## 2018-05-29 LAB — COMPLETE METABOLIC PANEL WITH GFR
AG Ratio: 1.7 (calc) (ref 1.0–2.5)
ALKALINE PHOSPHATASE (APISO): 31 U/L — AB (ref 33–130)
ALT: 17 U/L (ref 6–29)
AST: 24 U/L (ref 10–35)
Albumin: 4.5 g/dL (ref 3.6–5.1)
BUN: 14 mg/dL (ref 7–25)
CO2: 31 mmol/L (ref 20–32)
CREATININE: 0.97 mg/dL (ref 0.50–0.99)
Calcium: 9.9 mg/dL (ref 8.6–10.4)
Chloride: 103 mmol/L (ref 98–110)
GFR, Est African American: 74 mL/min/{1.73_m2} (ref >=60)
GFR, Est Non African American: 63 mL/min/{1.73_m2} (ref >=60)
GLUCOSE: 83 mg/dL (ref 65–99)
Globulin: 2.7 g/dL (calc) (ref 1.9–3.7)
Potassium: 4.5 mmol/L (ref 3.5–5.3)
Sodium: 142 mmol/L (ref 135–146)
Total Bilirubin: 0.3 mg/dL (ref 0.2–1.2)
Total Protein: 7.2 g/dL (ref 6.1–8.1)

## 2018-05-29 NOTE — Patient Instructions (Signed)
Ankle Exercises Ask your health care provider which exercises are safe for you. Do exercises exactly as told by your health care provider and adjust them as directed. It is normal to feel mild stretching, pulling, tightness, or discomfort as you do these exercises, but you should stop right away if you feel sudden pain or your pain gets worse. Do not begin these exercises until told by your health care provider. Stretching and range of motion exercises These exercises warm up your muscles and joints and improve the movement and flexibility of your ankle. These exercises also help to relieve pain, numbness, and tingling. Exercise A: Dorsiflexion/Plantar Flexion  1. Sit with your __________ knee straight or bent. Do not rest your foot on anything. 2. Flex your __________ ankle to tilt the top of your foot toward your shin. 3. Hold this position for __________ seconds. 4. Point your toes downward to tilt the top of your foot away from your shin. 5. Hold this position for __________ seconds. Repeat __________ times. Complete this exercise __________ times a day. Exercise B: Ankle Alphabet  1. Sit with your __________ foot supported at your lower leg. ? Do not rest your foot on anything. ? Make sure your foot has room to move freely. 2. Think of your __________ foot as a paintbrush, and move your foot to trace each letter of the alphabet in the air. Keep your hip and knee still while you trace. Make the letters as large as you can without increasing any discomfort. 3. Trace every letter from A to Z. Repeat __________ times. Complete this exercise __________ times a day. Exercise C: Ankle Dorsiflexion, Passive 1. Sit on a chair that is placed on a non-carpeted surface. 2. Place your __________ foot on the floor, directly under your __________ knee. Extend your __________ leg for support. 3. Keeping your heel down, slide your __________ foot back toward the chair until you feel a stretch at your  ankle or calf. If you do not feel a stretch, slide your buttocks forward to the edge of the chair. 4. Hold this stretch for __________ seconds. Repeat __________ times. Complete this stretch __________ times a day. Strengthening exercises These exercises build strength and endurance in your ankle. Endurance is the ability to use your muscles for a long time, even after they get tired. Exercise D: Dorsiflexors  1. Secure a rubber exercise band or tube to an object, such as a table leg, that will stay still when the band is pulled. Secure the other end around your __________ foot. 2. Sit on the floor, facing the object with your __________ leg extended. The band or tube should be slightly tense when your foot is relaxed. 3. Slowly flex your __________ ankle and toes to bring your foot toward you. 4. Hold this position for __________ seconds. 5. Slowly return your foot to the starting position, controlling the band as you do that. Repeat __________ times. Complete this exercise __________ times a day. Exercise E: Plantar Flexors  1. Sit on the floor with your __________ leg extended. 2. Loop a rubber exercise band or tube around the ball of your __________ foot. The ball of your foot is on the walking surface, right under your toes. The band or tube should be slightly tense when your foot is relaxed. 3. Slowly point your toes downward, pushing them away from you. 4. Hold this position for __________ seconds. 5. Slowly release the tension in the band or tube, controlling smoothly until your foot is  back in the starting position. Repeat __________ times. Complete this exercise __________ times a day. Exercise F: Towel Curls  1. Sit in a chair on a non-carpeted surface, and put your feet on the floor. 2. Place a towel in front of your feet. If told by your health care provider, add __________ to the end of the towel. 3. Keeping your heel on the floor, put your __________ foot on the  towel. 4. Pull the towel toward you by grabbing the towel with your toes and curling them under. Keep your heel on the floor. 5. Let your toes relax. 6. Grab the towel again. Keep going until the towel is completely underneath your foot. Repeat __________ times. Complete this exercise __________ times a day. Exercise G: Heel Raise ( Plantar Flexors, Standing) 1. Stand with your feet shoulder-width apart. 2. Keep your weight spread evenly over the width of your feet while you rise up on your toes. Use a wall or table to steady yourself, but try not to use it for support. 3. If this exercise is too easy, try these options: ? Shift your weight toward your __________ leg until you feel challenged. ? If told by your health care provider, lift your uninjured leg off the floor. 4. Hold this position for __________ seconds. Repeat __________ times. Complete this exercise __________ times a day. Exercise H: Tandem Walking 1. Stand with one foot directly in front of the other. 2. Slowly raise your back foot up, lifting your heel before your toes, and place it directly in front of your other foot. 3. Continue to walk in this heel-to-toe way for __________ or for as long as told by your health care provider. Have a countertop or wall nearby to use if needed to keep your balance, but try not to hold onto anything for support. Repeat __________ times. Complete this exercises __________ times a day. This information is not intended to replace advice given to you by your health care provider. Make sure you discuss any questions you have with your health care provider. Document Released: 10/04/2005 Document Revised: 07/20/2016 Document Reviewed: 08/08/2015 Elsevier Interactive Patient Education  2018 [place] Ask your health care provider which exercises are safe for you. Do exercises exactly as told by your health care provider and adjust them as directed. It is normal to feel mild  stretching, pulling, tightness, or discomfort as you do these exercises, but you should stop right away if you feel sudden pain or your pain gets worse. Do not begin these exercises until told by your health care provider. Stretching and range of motion exercises These exercises warm up your muscles and joints and improve the movement and flexibility of your elbow. These exercises also help to relieve pain, numbness, and tingling. Exercise A: Wrist extensor stretch 6. Extend your left / right elbow with your fingers pointing down. 7. Gently pull the palm of your left / right hand toward you until you feel a gentle stretch on the top of your forearm. 8. To increase the stretch, push your left / right hand toward the outer edge or pinkie side of your forearm. 9. Hold this position for __________ seconds. Repeat __________ times. Complete this exercise __________ times a day. If directed by your health care provider, repeat this stretch except do it with a bent elbow this time. Exercise B: Wrist flexor stretch  1. Extend your left / right elbow and turn your palm upward. 2. Gently pull your left /  right palm and fingertips back so your wrist extends and your fingers point more toward the ground. 3. You should feel a gentle stretch on the inside of your forearm. 4. Hold this position for __________ seconds. Repeat __________ times. Complete this exercise __________ times a day. If directed by your health care provider, repeat this stretch except do it with a bent elbow this time. Strengthening exercises These exercises build strength and endurance in your elbow. Endurance is the ability to use your muscles for a long time, even after they get tired. Exercise C: Wrist extensors  5. Sit with your left / right forearm palm-down and fully supported on a table or countertop. Your elbow should be resting below the height of your shoulder. 6. Let your left / right wrist extend over the edge of the  surface. 7. Loosely hold a __________ weight or a piece of rubber exercise band or tubing in your left / right hand. Slowly curl your left / right hand up toward your forearm. If you are using band or tubing, hold the band or tubing in place with your other hand to provide resistance. 8. Hold this position for __________ seconds. 9. Slowly return to the starting position. Repeat __________ times. Complete this exercise __________ times a day. Exercise D: Radial deviators  6. Stand with a __________ weight in your left / righthand. Or, sit while holding a rubber exercise band or tubing with your other arm supported on a table or countertop. Position your hand so your thumb is on top. 7. Raise your hand upward in front of you so your thumb travels toward your forearm, or pull up on the rubber tubing. 8. Hold this position for __________ seconds. 9. Slowly return to the starting position. Repeat __________ times. Complete this exercise __________ times a day. Exercise E: Eccentric wrist extensors 6. Sit with your left / right forearm palm-down and fully supported on a table or countertop. Your elbow should be resting below the height of your shoulder. 7. If told by your health care provider, hold a __________ weight in your hand. 8. Let your left / right wrist extend over the edge of the surface. 9. Use your other hand to lift up your left / right hand toward your forearm. Keep your forearm on the table. 10. Using only the muscles in your left / right hand, slowly lower your hand back down to the starting position. Repeat __________ times. Complete this exercise __________ times a day. This information is not intended to replace advice given to you by your health care provider. Make sure you discuss any questions you have with your health care provider. Document Released: 11/20/2005 Document Revised: 07/26/2016 Document Reviewed: 08/19/2015 Elsevier Interactive Patient Education  [employer].

## 2018-05-30 NOTE — Progress Notes (Signed)
CBC WNL. Alk phos stable.  All other labs are WNL.

## 2018-08-19 ENCOUNTER — Other Ambulatory Visit: Payer: Self-pay | Admitting: *Deleted

## 2018-08-19 MED ORDER — HYDROXYCHLOROQUINE SULFATE 200 MG PO TABS: ORAL_TABLET | ORAL | 1 refills | Status: DC

## 2018-08-19 NOTE — Telephone Encounter (Signed)
Refill request received via fax  Last visit: 05/29/18 Next Visit: 11/05/18 Labs: 05/29/18 CBC WNL. Alk phos stable. All other labs are WNL. PLQ Eye Exam: 11/06/17 WNL  Okay to refill per Dr. Rob Hickman

## 2018-08-21 ENCOUNTER — Other Ambulatory Visit: Payer: Self-pay | Admitting: Rheumatology

## 2018-08-21 MED ORDER — HYDROXYCHLOROQUINE SULFATE 200 MG PO TABS: ORAL_TABLET | ORAL | 0 refills | Status: DC

## 2018-08-21 NOTE — Telephone Encounter (Signed)
Refill for PLQ was sent to the pharmacy on 08/19/18. Spoke with patient and advised we have sent the prescription was sent to the pharmacy.Patient advised  We are not the doctor who prescribes Zocor. Patient states she mixed up what she need refilled and needs a 30 day supply sent to local pharmacy. Prescription sent to pharmacy.

## 2018-08-21 NOTE — Telephone Encounter (Signed)
Patient calling in reference to refill on generic Zocor 20mg  tabs. Patient needs refill sent into [facility], and a 30 day supply for rx sent to [facility] on [address] because she is completely out.

## 2018-08-27 ENCOUNTER — Telehealth: Payer: Self-pay | Admitting: Rheumatology

## 2018-08-27 NOTE — Telephone Encounter (Signed)
Patient left a voicemail stating she takes Plaquenil twice a day / 1 pill in the morning and 1 pill at night.  Patient states she is experiencing dizziness approximately 1/2 hour after she eats and takes her pill.  Patient is requesting a return call.

## 2018-08-27 NOTE — Telephone Encounter (Addendum)
Patient states she has been taking an increase in her Lamictal. Patient states she has been taking her PLQ 200 mg M-F. Patient states she the last couple of days she has been dizzy.Patient states she was hungry and she ate something sweet and she began to feel better. Patient states when she takes her medicine normally she drinks hot chocolate. But the last few mornings she has take her medications with hot tea and she has been getting dizzy. Patient advised she may have had a low blood sugar and that she may want to talk to her PCP. Patient verbalized understanding.

## 2018-09-16 ENCOUNTER — Telehealth (INDEPENDENT_AMBULATORY_CARE_PROVIDER_SITE_OTHER): Payer: Self-pay | Admitting: Rheumatology

## 2018-09-16 NOTE — Telephone Encounter (Signed)
Spoke with patient and she wanted to verify medication and dosage that were being prescribed by our office. Advised patient we prescribe the Plaquenil (Hyrdroxychloroquine) 200 mg tablets. Patient to take 1 tablet twice daily Monday thru Friday. Patient verbalized understanding.

## 2018-09-16 NOTE — Telephone Encounter (Signed)
Patient Melinda Sparks, stated wants to know what medications she is to be taking. Callback [phone]

## 2018-10-22 NOTE — Progress Notes (Signed)
Office Visit Note  Patient: Melinda Sparks             Date of Birth: [birth year]           MRN: [id number]             PCP: Bernerd Limbo, MD Referring: Bernerd Limbo, MD Visit Date: 11/05/2018 Occupation: @GUAROCC @  Subjective:  Right hand pain   History of Present Illness: Melinda Sparks is a 62 y.o. female  with history of seropositive rheumatoid arthritis, osteoarthritis, and osteoporosis.  She is taking PLQ 200 mg 1 tablet by mouth BID M-F.  She is scheduled for a PLQ eye exam on 11/08/18. She has not missed any doses of PLQ recently.  She states she has been having increased pain in the right hand and right wrist.  She states she has had difficulty gripping a pen and writing.  She notices intermittent joint swelling the right hand and wrist.  She has occasional right ankle pain and swelling. She denies any other joint pain or joint swelling.  She has had the yearly influenza vaccination.  She is on Fosamax 10 mg 1 tablet by mouth daily for osteoporosis.   Activities of Daily Living:  Patient reports morning stiffness for 5-10 minutes.   Patient Denies nocturnal pain.  Difficulty dressing/grooming: Denies Difficulty climbing stairs: Denies Difficulty getting out of chair: Denies Difficulty using hands for taps, buttons, cutlery, and/or writing: Reports  Review of Systems  Constitutional: Negative for fatigue.  HENT: Positive for mouth dryness. Negative for mouth sores and nose dryness.   Eyes: Positive for dryness. Negative for pain and visual disturbance.  Respiratory: Negative for cough, hemoptysis, shortness of breath and difficulty breathing.   Cardiovascular: Negative for chest pain, palpitations, hypertension and swelling in legs/feet.  Gastrointestinal: Negative for blood in stool, constipation and diarrhea.  Endocrine: Negative for increased urination.  Genitourinary: Negative for painful urination.  Musculoskeletal: Positive for arthralgias, joint pain and morning  stiffness. Negative for joint swelling, myalgias, muscle weakness, muscle tenderness and myalgias.  Skin: Negative for color change, pallor, rash, hair loss, nodules/bumps, skin tightness, ulcers and sensitivity to sunlight.  Allergic/Immunologic: Negative for susceptible to infections.  Neurological: Positive for dizziness. Negative for numbness, headaches and weakness.  Hematological: Negative for swollen glands.  Psychiatric/Behavioral: Positive for depressed mood and sleep disturbance. The patient is nervous/anxious.     PMFS History:  Patient Active Problem List   Diagnosis Date Noted  . Primary osteoarthritis of both feet 07/24/2017  . Primary osteoarthritis of both knees 07/24/2017  . Fracture of metacarpal neck of right hand, closed 05/01/2017  . High risk medication use 03/15/2017  . Primary osteoarthritis of both hands 03/15/2017  . Pain in right hand 03/15/2017  . Age-related osteoporosis without current pathological fracture 03/15/2017  . Rheumatoid arthritis involving multiple sites with positive rheumatoid factor + CCP MTP Erosions ([place]) 11/13/2016    Past Medical History:  Diagnosis Date  . Hypothyroidism   . Rheumatoid arthritis, adult ([place])   . Seizures ([place])    epilepsy since teenager.   . Thyroid disease     Family History  Problem Relation Age of Onset  . Breast cancer Mother   . Colon cancer Father 5   Past Surgical History:  Procedure Laterality Date  . APPENDECTOMY    . CLOSED REDUCTION METACARPAL WITH PERCUTANEOUS PINNING Right 05/01/2017   Procedure: CLOSED REDUCTION METACARPAL WITH PERCUTANEOUS PINNING;  Surgeon: Garald Balding, MD;  Location: [place];  Service: Orthopedics;  Laterality: Right;   Social History   Social History Narrative  . Not on file    Objective: Vital Signs: BP 108/71 (BP Location: Left Arm, Patient Position: Sitting, Cuff Size: Normal)   Pulse 88   Resp 12   Ht 5\' 4"  (1.626 m)   Wt 109 lb (49.4 kg)   BMI 18.71 kg/m     Physical Exam  Constitutional: She is oriented to person, place, and time. She appears well-developed and well-nourished.  HENT:  Head: Normocephalic and atraumatic.  Eyes: Conjunctivae and EOM are normal.  Neck: Normal range of motion.  Cardiovascular: Normal rate, regular rhythm, normal heart sounds and intact distal pulses.  Pulmonary/Chest: Effort normal and breath sounds normal.  Abdominal: Soft. Bowel sounds are normal.  Lymphadenopathy:    She has no cervical adenopathy.  Neurological: She is alert and oriented to person, place, and time.  Skin: Skin is warm and dry. Capillary refill takes less than 2 seconds.  Psychiatric: She has a normal mood and affect. Her behavior is normal.  Nursing note and vitals reviewed.    Musculoskeletal Exam: C-spine, thoracic spine, and lumbar spine good ROM.  No midline spinal tenderness.  No SI joint tenderness.  Shoulder joints, elbow joints, wrist joints, MCPs, PIPs, and DIPs good ROM with no synovitis.  She has PIP and DIP synovial thickening consistent with osteoarthritis.  Complete fist formation bilaterally. Hip joints, knee joints, ankle joints, MTPs, PIPs, and DIPs good ROM with no synovitis.  No warmth or effusion of knee joints.  No tenderness or swelling of ankle joints.  No tenderness of trochanteric bursa bilaterally.   CDAI Exam: CDAI Score: 0.6  Patient Global Assessment: 3 (mm); Provider Global Assessment: 3 (mm) Swollen: 0 ; Tender: 0  Joint Exam   Not documented   There is currently no information documented on the homunculus. Go to the Rheumatology activity and complete the homunculus joint exam.  Investigation: No additional findings.  Imaging: No results found.  Recent Labs: Lab Results  Component Value Date   WBC 4.3 05/29/2018   HGB 13.2 05/29/2018   PLT 292 05/29/2018   NA 142 05/29/2018   K 4.5 05/29/2018   CL 103 05/29/2018   CO2 31 05/29/2018   GLUCOSE 83 05/29/2018   BUN 14 05/29/2018   CREATININE  0.97 05/29/2018   BILITOT 0.3 05/29/2018   ALKPHOS 34 07/26/2017   AST 24 05/29/2018   ALT 17 05/29/2018   PROT 7.2 05/29/2018   ALBUMIN 4.5 07/26/2017   CALCIUM 9.9 05/29/2018   GFRAA 74 05/29/2018    Speciality Comments: PLQ Eye Exam: 11/06/17 WNL  Procedures:  No procedures performed Allergies: Felbamate; Tiagabine; and Zonisamide        Assessment / Plan:     Visit Diagnoses: Rheumatoid arthritis involving multiple sites with positive rheumatoid factor + CCP MTP Erosions ([place]): She has no synovitis on exam.  She has not had any recent rheumatoid arthritis flares.  She has been having increased pain in the right hand and difficulty writing. She gives a history of intermittent right ankle pain and swelling. She has no tenderness on exam.  Her right hand pain is likely due to osteoarthritic changes.  She is taking PLQ 200 mg 1 tablet by mouth BID M-F.  She has not missed any doses of PLQ recently.  She does not need a refill at this time.  She has a PLQ eye exam scheduled for 11/08/18. CBC and CMP were drawn  today to monitor for drug toxicity.  She was advised to notify us if she develops increased joint pain or joint swelling.  She will follow up in 5 months.   High risk medication use -  PLQ (treated with methotrexate intermittently-discontinued due to elevated creatinine. Discussed Enbrel in the past which patient declined)eye exam: 11/06/2017.  Her next eye exam is 11/08/18.  She was given a PLQ eye exam form.  CBC and CMP were drawn today to monitor for drug toxicity. She has had the seasonal influenza vaccination.- Plan: CBC with Differential/Platelet, COMPLETE METABOLIC PANEL WITH GFR  Primary osteoarthritis of both hands: She has PIP and DIP synovial thickening. Bilateral CMC joint synovial thickening.  Complete fist formation bilaterally.  No synovitis noted.  She has been having increased difficulty writing and gripping.  We discussed joint protection and muscle strengthening.  She  was given a handout of hand exercises today in the office. A few hand exercises were demonstrated in the office today.   Primary osteoarthritis of both knees: No warmth or effusion.  Good ROM with no discomfort.   Primary osteoarthritis of both feet: She has osteoarthritic changes in both feet. She has no discomfort in her feet at this time.  She wears proper fitting shoes.    Age-related osteoporosis without current pathological fracture - The BMD measured at Femur Total Left is 0.613 g/cm2 with a T-score of -3.1 on 04/25/2017 DEXA ordered by Dr Coletta Memos.  She is currently on Fosamax 10 mg daily and calcium carbonate 600 mg daily.  She has not had any recent falls or fractures.   Other medical conditions are listed as follows:   History of seizure disorder  History of hyperlipidemia  History of anxiety  History of hypothyroidism  Lateral epicondylitis, left elbow   Orders: Orders Placed This Encounter  Procedures  . CBC with Differential/Platelet  . COMPLETE METABOLIC PANEL WITH GFR   No orders of the defined types were placed in this encounter.     Follow-Up Instructions: Return in about 5 months (around 04/06/2019) for Rheumatoid arthritis, Osteoarthritis, Osteoporosis.   Ofilia Neas, PA-C  Note - This record has been created using Dragon software.  Chart creation errors have been sought, but may not always  have been located. Such creation errors do not reflect on  the standard of medical care.

## 2018-11-05 ENCOUNTER — Ambulatory Visit: Payer: Medicare HMO | Admitting: Physician Assistant

## 2018-11-05 ENCOUNTER — Encounter: Payer: Self-pay | Admitting: Physician Assistant

## 2018-11-05 VITALS — BP 108/71 | HR 88 | Resp 12 | Ht 64.0 in | Wt 109.0 lb

## 2018-11-05 DIAGNOSIS — M0579 Rheumatoid arthritis with rheumatoid factor of multiple sites without organ or systems involvement: Secondary | ICD-10-CM | POA: Diagnosis not present

## 2018-11-05 DIAGNOSIS — M19041 Primary osteoarthritis, right hand: Secondary | ICD-10-CM

## 2018-11-05 DIAGNOSIS — M170 Bilateral primary osteoarthritis of knee: Secondary | ICD-10-CM

## 2018-11-05 DIAGNOSIS — Z8659 Personal history of other mental and behavioral disorders: Secondary | ICD-10-CM

## 2018-11-05 DIAGNOSIS — M19042 Primary osteoarthritis, left hand: Secondary | ICD-10-CM

## 2018-11-05 DIAGNOSIS — M7712 Lateral epicondylitis, left elbow: Secondary | ICD-10-CM

## 2018-11-05 DIAGNOSIS — Z8639 Personal history of other endocrine, nutritional and metabolic disease: Secondary | ICD-10-CM

## 2018-11-05 DIAGNOSIS — Z79899 Other long term (current) drug therapy: Secondary | ICD-10-CM

## 2018-11-05 DIAGNOSIS — M19071 Primary osteoarthritis, right ankle and foot: Secondary | ICD-10-CM

## 2018-11-05 DIAGNOSIS — M19072 Primary osteoarthritis, left ankle and foot: Secondary | ICD-10-CM

## 2018-11-05 DIAGNOSIS — M810 Age-related osteoporosis without current pathological fracture: Secondary | ICD-10-CM

## 2018-11-05 DIAGNOSIS — Z8669 Personal history of other diseases of the nervous system and sense organs: Secondary | ICD-10-CM

## 2018-11-05 LAB — CBC WITH DIFFERENTIAL/PLATELET
BASOS PCT: 1.2 %
Basophils Absolute: 49 cells/uL (ref 0–200)
Eosinophils Absolute: 70 cells/uL (ref 15–500)
Eosinophils Relative: 1.7 %
HCT: 39.6 % (ref 35.0–45.0)
Hemoglobin: 13.4 g/dL (ref 11.7–15.5)
LYMPHS ABS: 1193 {cells}/uL (ref 850–3900)
MCH: 30.5 pg (ref 27.0–33.0)
MCHC: 33.8 g/dL (ref 32.0–36.0)
MCV: 90 fL (ref 80.0–100.0)
MONOS PCT: 13.7 %
MPV: 9.1 fL (ref 7.5–12.5)
Neutro Abs: 2226 cells/uL (ref 1500–7800)
Neutrophils Relative %: 54.3 %
PLATELETS: 290 10*3/uL (ref 140–400)
RBC: 4.4 10*6/uL (ref 3.80–5.10)
RDW: 12.4 % (ref 11.0–15.0)
TOTAL LYMPHOCYTE: 29.1 %
WBC mixed population: 562 cells/uL (ref 200–950)
WBC: 4.1 10*3/uL (ref 3.8–10.8)

## 2018-11-05 LAB — COMPLETE METABOLIC PANEL WITH GFR
AG Ratio: 1.7 (calc) (ref 1.0–2.5)
ALT: 20 U/L (ref 6–29)
AST: 28 U/L (ref 10–35)
Albumin: 4.4 g/dL (ref 3.6–5.1)
Alkaline phosphatase (APISO): 32 U/L — ABNORMAL LOW (ref 33–130)
BILIRUBIN TOTAL: 0.4 mg/dL (ref 0.2–1.2)
BUN/Creatinine Ratio: 16 (calc) (ref 6–22)
BUN: 17 mg/dL (ref 7–25)
CHLORIDE: 101 mmol/L (ref 98–110)
CO2: 32 mmol/L (ref 20–32)
Calcium: 10.5 mg/dL — ABNORMAL HIGH (ref 8.6–10.4)
Creat: 1.09 mg/dL — ABNORMAL HIGH (ref 0.50–0.99)
GFR, EST AFRICAN AMERICAN: 63 mL/min/{1.73_m2} (ref >=60)
GFR, Est Non African American: 55 mL/min/{1.73_m2} — ABNORMAL LOW (ref >=60)
GLUCOSE: 56 mg/dL — AB (ref 65–99)
Globulin: 2.6 g/dL (calc) (ref 1.9–3.7)
Potassium: 4.1 mmol/L (ref 3.5–5.3)
Sodium: 140 mmol/L (ref 135–146)
TOTAL PROTEIN: 7 g/dL (ref 6.1–8.1)

## 2018-11-05 NOTE — Patient Instructions (Signed)

## 2018-11-06 NOTE — Progress Notes (Signed)
CBC WNL. Glucose is borderline low.  Calcium is elevated. Please advise patient to avoid taking calcium supplement at this time.  Creatinine is mildly elevated and GFR is low.  Please advise patient to avoid all NSAIDs. Please advise patient to have BMP drawn in 1 month.  Alk phos chronically low.

## 2018-11-11 ENCOUNTER — Telehealth: Payer: Self-pay | Admitting: Rheumatology

## 2018-11-11 NOTE — Telephone Encounter (Signed)
Opened in error

## 2018-12-11 ENCOUNTER — Other Ambulatory Visit: Payer: Self-pay

## 2018-12-11 DIAGNOSIS — Z79899 Other long term (current) drug therapy: Secondary | ICD-10-CM

## 2018-12-11 LAB — BASIC METABOLIC PANEL WITH GFR
BUN: 20 mg/dL (ref 7–25)
CALCIUM: 10 mg/dL (ref 8.6–10.4)
CO2: 31 mmol/L (ref 20–32)
CREATININE: 0.99 mg/dL (ref 0.50–0.99)
Chloride: 100 mmol/L (ref 98–110)
GFR, EST AFRICAN AMERICAN: 71 mL/min/{1.73_m2} (ref >=60)
GFR, EST NON AFRICAN AMERICAN: 61 mL/min/{1.73_m2} (ref >=60)
Glucose, Bld: 70 mg/dL (ref 65–99)
POTASSIUM: 4.4 mmol/L (ref 3.5–5.3)
Sodium: 140 mmol/L (ref 135–146)

## 2019-04-08 ENCOUNTER — Ambulatory Visit: Payer: Medicare HMO | Admitting: Physician Assistant

## 2019-04-17 ENCOUNTER — Encounter: Payer: Self-pay | Admitting: Gastroenterology

## 2019-04-17 NOTE — Progress Notes (Signed)
Office Visit Note  Patient: Melinda Sparks             Date of Birth: 1956-09-06           MRN: [id number]             PCP: Bernerd Limbo, MD Referring: Bernerd Limbo, MD Visit Date: 04/30/2019 Occupation: @GUAROCC @  Subjective:  Pain in both hands    History of Present Illness: Melinda Sparks is a 63 y.o. female with history of seropositive rheumatoid arthritis, osteoarthritis, and osteoporosis.  She is taking PLQ 200 mg BID M-F. She denies missing any doses recently. She experiences stiffness in both hands.  She experiences intermittent pain in both hands.  She occasional pain in both feet.  She wears proper fitting shoes and toe spacers.  She denies any knee joint pain or joint swelling. She uses aspercreme topically PRN for pain relief.   She reports her PCP took her off of fosamax. She has a DEXA scheduled 07/22/19.    Activities of Daily Living:  Patient reports morning stiffness for 10  minutes.   Patient Reports nocturnal pain.  Difficulty dressing/grooming: Denies Difficulty climbing stairs: Denies Difficulty getting out of chair: Denies Difficulty using hands for taps, buttons, cutlery, and/or writing: Denies  Review of Systems  Constitutional: Negative for fatigue.  HENT: Negative for mouth sores, mouth dryness and nose dryness.   Eyes: Negative for pain, itching, visual disturbance and dryness.  Respiratory: Negative for cough, hemoptysis, shortness of breath, wheezing and difficulty breathing.   Cardiovascular: Negative for chest pain, palpitations, hypertension and swelling in legs/feet.  Gastrointestinal: Negative for abdominal pain, blood in stool, constipation and diarrhea.  Endocrine: Negative for increased urination.  Genitourinary: Negative for painful urination and pelvic pain.  Musculoskeletal: Positive for arthralgias, joint pain and morning stiffness. Negative for joint swelling, myalgias, muscle weakness, muscle tenderness and myalgias.  Skin:  Negative for color change, pallor, rash, hair loss, nodules/bumps, redness, skin tightness, ulcers and sensitivity to sunlight.  Allergic/Immunologic: Negative for susceptible to infections.  Neurological: Negative for dizziness, light-headedness, numbness, headaches and weakness.  Hematological: Negative for swollen glands.  Psychiatric/Behavioral: Negative for depressed mood, confusion and sleep disturbance. The patient is not nervous/anxious.     PMFS History:  Patient Active Problem List   Diagnosis Date Noted  . Primary osteoarthritis of both feet 07/24/2017  . Primary osteoarthritis of both knees 07/24/2017  . Fracture of metacarpal neck of right hand, closed 05/01/2017  . High risk medication use 03/15/2017  . Primary osteoarthritis of both hands 03/15/2017  . Pain in right hand 03/15/2017  . Age-related osteoporosis without current pathological fracture 03/15/2017  . Rheumatoid arthritis involving multiple sites with positive rheumatoid factor + CCP MTP Erosions ([place]) 11/13/2016    Past Medical History:  Diagnosis Date  . Hypothyroidism   . Rheumatoid arthritis, adult ([place])   . Seizures ([place])    epilepsy since teenager.   . Thyroid disease     Family History  Problem Relation Age of Onset  . Breast cancer Mother   . Colon cancer Father 9   Past Surgical History:  Procedure Laterality Date  . APPENDECTOMY    . CLOSED REDUCTION METACARPAL WITH PERCUTANEOUS PINNING Right 05/01/2017   Procedure: CLOSED REDUCTION METACARPAL WITH PERCUTANEOUS PINNING;  Surgeon: Garald Balding, MD;  Location: [place];  Service: Orthopedics;  Laterality: Right;   Social History   Social History Narrative  . Not on file   Immunization History  Administered Date(s) Administered  . Influenza-Unspecified 09/20/2017  . Pneumococcal Polysaccharide-23 11/20/2011  . Tdap 10/09/2017     Objective: Vital Signs: BP 121/76 (BP Location: Left Arm, Patient Position: Sitting, Cuff Size: Normal)    Pulse 77   Resp 13   Ht 5\' 4"  (1.626 m)   Wt 104 lb 12.8 oz (47.5 kg)   BMI 17.99 kg/m    Physical Exam Vitals signs and nursing note reviewed.  Constitutional:      Appearance: She is well-developed.  HENT:     Head: Normocephalic and atraumatic.  Eyes:     Conjunctiva/sclera: Conjunctivae normal.  Neck:     Musculoskeletal: Normal range of motion.  Cardiovascular:     Rate and Rhythm: Normal rate and regular rhythm.     Heart sounds: Normal heart sounds.  Pulmonary:     Effort: Pulmonary effort is normal.     Breath sounds: Normal breath sounds.  Abdominal:     General: Bowel sounds are normal.     Palpations: Abdomen is soft.  Lymphadenopathy:     Cervical: No cervical adenopathy.  Skin:    General: Skin is warm and dry.     Capillary Refill: Capillary refill takes less than 2 seconds.  Neurological:     Mental Status: She is alert and oriented to person, place, and time.  Psychiatric:        Behavior: Behavior normal.      Musculoskeletal Exam: C-spine, thoracic spine, and lumbar spine good ROM.  No midline spinal tenderness.  Shoulder joints, elbow joints, wrist joints, MCPs, PIPs, and DIPs good ROM with no synovitis.  Tenderness of right 1st, 2nd, and 3rd MCP joints and right 2nd PIP joint.  Hip joints, knee joints, ankle joints, MTPs, PIPs, and DIPs good ROM with no synovitis.  No warmth or effusion of knee joints.  No tenderness or swelling of ankle joints.    CDAI Exam: CDAI Score: Not documented Patient Global Assessment: Not documented; Provider Global Assessment: Not documented Swollen: 0 ; Tender: 4  Joint Exam      Right  Left  MCP 1   Tender     MCP 2   Tender     MCP 3   Tender     PIP 2   Tender        Investigation: No additional findings.  Imaging: No results found.  Recent Labs: Lab Results  Component Value Date   WBC 4.1 11/05/2018   HGB 13.4 11/05/2018   PLT 290 11/05/2018   NA 140 12/11/2018   K 4.4 12/11/2018   CL 100  12/11/2018   CO2 31 12/11/2018   GLUCOSE 70 12/11/2018   BUN 20 12/11/2018   CREATININE 0.99 12/11/2018   BILITOT 0.4 11/05/2018   ALKPHOS 34 07/26/2017   AST 28 11/05/2018   ALT 20 11/05/2018   PROT 7.0 11/05/2018   ALBUMIN 4.5 07/26/2017   CALCIUM 10.0 12/11/2018   GFRAA 71 12/11/2018    Speciality Comments: PLQ Eye Exam: 11/07/18 WNL @ [facility] follow up in 1 year Prior therapy includes: MTX (elevated creatinine)   Procedures:  No procedures performed Allergies: Felbamate; Tiagabine; and Zonisamide        Assessment / Plan:     Visit Diagnoses: Rheumatoid arthritis involving multiple sites with positive rheumatoid factor + CCP MTP Erosions ([place]): She has no synovitis on exam.  She has tenderness and synovial thickening of the right 1st, 2nd, and 3rd MCP joint and  2nd PIP joint.  No inflammation was noted.  She is clinically doing well on Plaquenil 200 mg 1 tablet by mouth twice daily Monday-Friday.  She will continue on this current treatment regimen.  She does not need any refills at this time. She was advised to notify us if she develops increased joint pain or joint swelling.  She will follow up in 5 months.   High risk medication use -She is on Plaquenil 200 mg twice daily Monday through Friday only.  Last Plaquenil eye exam normal on 11/17/2018.  Most recent CBC/CMP within normal limits except elevated calcium and mildly elevated creatinine with low GFR on 12/02/2018.  Repeat BMP on 12/11/2018 within normal limits.  Due for CBC/CMP today and will monitor every 5 months. (treated with methotrexate intermittently-discontinued due to elevated creatinine. Discussed Enbrel in the past which patient declined) - Plan: COMPLETE METABOLIC PANEL WITH GFR, CBC with Differential/Platelet  Primary osteoarthritis of both hands: She has PIP and DIP synovial thickening consistent with osteoarthritis of both hands.  She has no synovitis.  She has complete fist formation bilaterally.   Joint protection and muscle strengthening were discussed. She was given a handout of hand exercises that she can perform. She will continue using aspercreme topically prn for pain relief.   Primary osteoarthritis of both knees: No warmth or effusion.  She has good ROM with no discomfort.    Primary osteoarthritis of both feet: She has intermittent pain in both feet.  She wears proper fitting shoes and toe spacers, which provide some comfort.   Age-related osteoporosis without current pathological fracture -According to the patient her PCP took her off of Fosamax 10 mg po daily.  She has a DEXA scheduled on 07/22/19.  Managed by Dr Coletta Memos. DEXA on 04/25/2017 BMD measured at Femur Total Left is 0.613 g/cm2 with a T-score of -3.1. She is currently on calcium carbonate 600 mg daily.   Other medical conditions are listed as follows:   History of seizure disorder  History of hyperlipidemia  History of anxiety  History of hypothyroidism  Lateral epicondylitis, left elbow  Noncompliance   Orders: Orders Placed This Encounter  Procedures  . COMPLETE METABOLIC PANEL WITH GFR  . CBC with Differential/Platelet   No orders of the defined types were placed in this encounter.   Follow-Up Instructions: Return in about 5 months (around 09/30/2019) for Rheumatoid arthritis.   Ofilia Neas, PA-C  Note - This record has been created using Dragon software.  Chart creation errors have been sought, but may not always  have been located. Such creation errors do not reflect on  the standard of medical care.

## 2019-04-22 ENCOUNTER — Encounter: Payer: Self-pay | Admitting: Gastroenterology

## 2019-04-23 ENCOUNTER — Other Ambulatory Visit: Payer: Self-pay | Admitting: Family Medicine

## 2019-04-23 DIAGNOSIS — Z1231 Encounter for screening mammogram for malignant neoplasm of breast: Secondary | ICD-10-CM

## 2019-04-23 DIAGNOSIS — M810 Age-related osteoporosis without current pathological fracture: Secondary | ICD-10-CM

## 2019-04-30 ENCOUNTER — Encounter: Payer: Self-pay | Admitting: Physician Assistant

## 2019-04-30 ENCOUNTER — Ambulatory Visit: Payer: Medicare HMO | Admitting: Physician Assistant

## 2019-04-30 ENCOUNTER — Other Ambulatory Visit: Payer: Self-pay

## 2019-04-30 VITALS — BP 121/76 | HR 77 | Resp 13 | Ht 64.0 in | Wt 104.8 lb

## 2019-04-30 DIAGNOSIS — Z79899 Other long term (current) drug therapy: Secondary | ICD-10-CM

## 2019-04-30 DIAGNOSIS — Z91199 Patient's noncompliance with other medical treatment and regimen due to unspecified reason: Secondary | ICD-10-CM

## 2019-04-30 DIAGNOSIS — Z9119 Patient's noncompliance with other medical treatment and regimen: Secondary | ICD-10-CM

## 2019-04-30 DIAGNOSIS — M19072 Primary osteoarthritis, left ankle and foot: Secondary | ICD-10-CM

## 2019-04-30 DIAGNOSIS — M0579 Rheumatoid arthritis with rheumatoid factor of multiple sites without organ or systems involvement: Secondary | ICD-10-CM | POA: Diagnosis not present

## 2019-04-30 DIAGNOSIS — M810 Age-related osteoporosis without current pathological fracture: Secondary | ICD-10-CM

## 2019-04-30 DIAGNOSIS — M19041 Primary osteoarthritis, right hand: Secondary | ICD-10-CM | POA: Diagnosis not present

## 2019-04-30 DIAGNOSIS — Z8669 Personal history of other diseases of the nervous system and sense organs: Secondary | ICD-10-CM

## 2019-04-30 DIAGNOSIS — M170 Bilateral primary osteoarthritis of knee: Secondary | ICD-10-CM

## 2019-04-30 DIAGNOSIS — Z8639 Personal history of other endocrine, nutritional and metabolic disease: Secondary | ICD-10-CM

## 2019-04-30 DIAGNOSIS — M7712 Lateral epicondylitis, left elbow: Secondary | ICD-10-CM

## 2019-04-30 DIAGNOSIS — Z8659 Personal history of other mental and behavioral disorders: Secondary | ICD-10-CM

## 2019-04-30 DIAGNOSIS — M19071 Primary osteoarthritis, right ankle and foot: Secondary | ICD-10-CM

## 2019-04-30 DIAGNOSIS — M19042 Primary osteoarthritis, left hand: Secondary | ICD-10-CM

## 2019-04-30 NOTE — Patient Instructions (Signed)

## 2019-05-01 ENCOUNTER — Telehealth: Payer: Self-pay | Admitting: Rheumatology

## 2019-05-01 LAB — COMPLETE METABOLIC PANEL WITH GFR
AG Ratio: 1.7 (calc) (ref 1.0–2.5)
ALT: 19 U/L (ref 6–29)
AST: 26 U/L (ref 10–35)
Albumin: 4.5 g/dL (ref 3.6–5.1)
Alkaline phosphatase (APISO): 27 U/L — ABNORMAL LOW (ref 37–153)
BUN/Creatinine Ratio: 21 (calc) (ref 6–22)
BUN: 22 mg/dL (ref 7–25)
CO2: 27 mmol/L (ref 20–32)
Calcium: 10.2 mg/dL (ref 8.6–10.4)
Chloride: 104 mmol/L (ref 98–110)
Creat: 1.05 mg/dL — ABNORMAL HIGH (ref 0.50–0.99)
GFR, Est African American: 66 mL/min/{1.73_m2} (ref >=60)
GFR, Est Non African American: 57 mL/min/{1.73_m2} — ABNORMAL LOW (ref >=60)
Globulin: 2.7 g/dL (calc) (ref 1.9–3.7)
Glucose, Bld: 82 mg/dL (ref 65–99)
Potassium: 4.7 mmol/L (ref 3.5–5.3)
Sodium: 139 mmol/L (ref 135–146)
Total Bilirubin: 0.5 mg/dL (ref 0.2–1.2)
Total Protein: 7.2 g/dL (ref 6.1–8.1)

## 2019-05-01 LAB — CBC WITH DIFFERENTIAL/PLATELET
Absolute Monocytes: 598 cells/uL (ref 200–950)
Basophils Absolute: 51 cells/uL (ref 0–200)
Basophils Relative: 1.1 %
Eosinophils Absolute: 41 cells/uL (ref 15–500)
Eosinophils Relative: 0.9 %
HCT: 38.5 % (ref 35.0–45.0)
Hemoglobin: 13 g/dL (ref 11.7–15.5)
Lymphs Abs: 1155 cells/uL (ref 850–3900)
MCH: 30.9 pg (ref 27.0–33.0)
MCHC: 33.8 g/dL (ref 32.0–36.0)
MCV: 91.4 fL (ref 80.0–100.0)
MPV: 9.3 fL (ref 7.5–12.5)
Monocytes Relative: 13 %
Neutro Abs: 2755 cells/uL (ref 1500–7800)
Neutrophils Relative %: 59.9 %
Platelets: 305 10*3/uL (ref 140–400)
RBC: 4.21 10*6/uL (ref 3.80–5.10)
RDW: 12.2 % (ref 11.0–15.0)
Total Lymphocyte: 25.1 %
WBC: 4.6 10*3/uL (ref 3.8–10.8)

## 2019-05-01 NOTE — Telephone Encounter (Signed)
Patient left a voicemail requesting a return call regarding her labwork results.   °

## 2019-05-01 NOTE — Progress Notes (Signed)
Creatinine is mildly elevated and GFR is 57.  Please advise patient to avoid NSAIDs.   CBC WNL.

## 2019-05-01 NOTE — Telephone Encounter (Signed)
Reviewed results with patient again. Reassured patient recommendations.

## 2019-05-20 ENCOUNTER — Ambulatory Visit: Payer: Medicare HMO | Admitting: *Deleted

## 2019-05-20 ENCOUNTER — Other Ambulatory Visit: Payer: Self-pay

## 2019-05-20 ENCOUNTER — Telehealth: Payer: Self-pay | Admitting: *Deleted

## 2019-05-20 VITALS — Ht 64.0 in | Wt 103.5 lb

## 2019-05-20 DIAGNOSIS — Z86010 Personal history of colonic polyps: Secondary | ICD-10-CM

## 2019-05-20 MED ORDER — SUPREP BOWEL PREP KIT 17.5-3.13-1.6 GM/177ML PO SOLN: 1.0000 | Freq: Once | ORAL | 0 refills | Status: AC

## 2019-05-20 NOTE — Telephone Encounter (Signed)
Dr Ardis Hughs,  I completed a pre visit on Mrs Melinda Sparks this morning with her husband present-  She has a hx of seizures- Last documented seizure was 02-2019 per Husband- Mrs Melinda Sparks told me 2 weeks ago at her mothers, she "passed out" but this was not witnessed by any one per Her husband- she takes Lamictal and Keppra- she has been instructed to take her seizure meds bt 430 am the morning of her 730 am 6-30 colonoscopy.      Is it ok to proceed as scheduled or does she need an OV?  Please advise  Thanks so much for your time,Marie

## 2019-05-20 NOTE — Telephone Encounter (Signed)
Thank you. Will proceed as scheduled,Melinda Sparks

## 2019-05-20 NOTE — Progress Notes (Signed)
No egg or soy allergy known to patient  No issues with past sedation with any surgeries  or procedures, no intubation problems  No diet pills per patient No home 02 use per patient  No blood thinners per patient  Pt denies issues with constipation  No A fib or A flutter  EMMI video sent to pt's e mail  Pt and husband state last seizure was 02-2019- she is on Keppra and Lamictal - TE to Dr Ardis Hughs to verify ok to proceed with this colon without OV  Husband has a colon same day 6-30 with Suprep- he asked to do the same Suprep for Pt so he can help her plus he does not think she can drink the gallon of [place] with her size- weight is 103 lb   Pt is aware that care partner will wait in the car during parking lot; if they feel like they will be too hot to wait in the car; they may wait in the lobby.  We want them to wear a mask (we do not have any that we can provide them), practice social distancing, and we will check their temperatures when they get here.  I did remind patient that their care partner needs to stay in the parking lot the entire time. Pt will wear mask into building  Pt verified name, DOB, address and insurance during PV today. Pt mailed instruction packet to included paper to complete and mail back to [facility] with addressed and stamped envelope, Emmi video, copy of consent form to read and not return, and instructions. Suprep $15  coupon mailed in packet. PV completed over the phone. Pt encouraged to call with questions or issues

## 2019-05-20 NOTE — Telephone Encounter (Signed)
Should be OK for EC procedure later this month still.  thanks

## 2019-06-02 ENCOUNTER — Telehealth: Payer: Self-pay | Admitting: *Deleted

## 2019-06-02 ENCOUNTER — Telehealth: Payer: Self-pay | Admitting: Gastroenterology

## 2019-06-02 NOTE — Telephone Encounter (Signed)
LM on Vmail to call back regarding Covid-19 screening questions Covid-19 Screening Questions  Do you now or have you had a fever in the last 14 days?       Do you have any respiratory symptoms of shortness of breath or cough now or in the last 14 days?      Do you have any family members or close contacts with diagnosed or suspected Covid-19 in the past 14 days?   Have you been tested for Covid-19 and found to be positive?

## 2019-06-02 NOTE — Telephone Encounter (Signed)
Patient and husband called in and patient stated that she wanted to speak with the nurse regarding some question about the procedure prep.

## 2019-06-02 NOTE — Telephone Encounter (Signed)
Spoke with patient answer no to all questions.

## 2019-06-02 NOTE — Telephone Encounter (Signed)
Spoke with patient and advised essential meds were ok to take with the prep before her colonoscopy.

## 2019-06-02 NOTE — Telephone Encounter (Signed)
Returned phone message from patient . Opened this window in error.

## 2019-06-03 ENCOUNTER — Other Ambulatory Visit: Payer: Self-pay

## 2019-06-03 ENCOUNTER — Ambulatory Visit (AMBULATORY_SURGERY_CENTER): Payer: Medicare HMO | Admitting: Gastroenterology

## 2019-06-03 ENCOUNTER — Encounter: Payer: Self-pay | Admitting: Gastroenterology

## 2019-06-03 VITALS — BP 141/84 | HR 72 | Temp 98.9°F | Resp 21 | Ht 64.0 in | Wt 103.0 lb

## 2019-06-03 DIAGNOSIS — D122 Benign neoplasm of ascending colon: Secondary | ICD-10-CM

## 2019-06-03 DIAGNOSIS — Z86010 Personal history of colonic polyps: Secondary | ICD-10-CM | POA: Diagnosis not present

## 2019-06-03 DIAGNOSIS — Z800 Family history of malignant neoplasm of digestive organs: Secondary | ICD-10-CM

## 2019-06-03 DIAGNOSIS — D123 Benign neoplasm of transverse colon: Secondary | ICD-10-CM | POA: Diagnosis not present

## 2019-06-03 MED ORDER — SODIUM CHLORIDE 0.9 % IV SOLN: 500.0000 mL | Freq: Once | INTRAVENOUS | Status: DC

## 2019-06-03 NOTE — Progress Notes (Signed)
To PACU, VSS. Report to Rn.tb 

## 2019-06-03 NOTE — Patient Instructions (Signed)
4 polyps removed today You will receive a letter in 2-3 weeks from Dr Ardis Hughs If you do not please call us for results from pathology of polyps     YOU HAD AN ENDOSCOPIC PROCEDURE TODAY AT [facility]:   Refer to the procedure report that was given to you for any specific questions about what was found during the examination.  If the procedure report does not answer your questions, please call your gastroenterologist to clarify.  If you requested that your care partner not be given the details of your procedure findings, then the procedure report has been included in a sealed envelope for you to review at your convenience later.  YOU SHOULD EXPECT: Some feelings of bloating in the abdomen. Passage of more gas than usual.  Walking can help get rid of the air that was put into your GI tract during the procedure and reduce the bloating. If you had a lower endoscopy (such as a colonoscopy or flexible sigmoidoscopy) you may notice spotting of blood in your stool or on the toilet paper. If you underwent a bowel prep for your procedure, you may not have a normal bowel movement for a few days.  Please Note:  You might notice some irritation and congestion in your nose or some drainage.  This is from the oxygen used during your procedure.  There is no need for concern and it should clear up in a day or so.  SYMPTOMS TO REPORT IMMEDIATELY:   Following lower endoscopy (colonoscopy or flexible sigmoidoscopy):  Excessive amounts of blood in the stool  Significant tenderness or worsening of abdominal pains  Swelling of the abdomen that is new, acute  Fever of 100F or higher   For urgent or emergent issues, a gastroenterologist can be reached at any hour by calling [phone].   DIET:  We do recommend a small meal at first, but then you may proceed to your regular diet.  Drink plenty of fluids but you should avoid alcoholic beverages for 24 hours.  ACTIVITY:  You should plan to take  it easy for the rest of today and you should NOT DRIVE or use heavy machinery until tomorrow (because of the sedation medicines used during the test).    FOLLOW UP: Our staff will call the number listed on your records 48-72 hours following your procedure to check on you and address any questions or concerns that you may have regarding the information given to you following your procedure. If we do not reach you, we will leave a message.  We will attempt to reach you two times.  During this call, we will ask if you have developed any symptoms of COVID 19. If you develop any symptoms (ie: fever, flu-like symptoms, shortness of breath, cough etc.) before then, please call [phone].  If you test positive for Covid 19 in the 2 weeks post procedure, please call and report this information to [place].    If any biopsies were taken you will be contacted by phone or by letter within the next 1-3 weeks.  Please call us at [phone] if you have not heard about the biopsies in 3 weeks.    SIGNATURES/CONFIDENTIALITY: You and/or your care partner have signed paperwork which will be entered into your electronic medical record.  These signatures attest to the fact that that the information above on your After Visit Summary has been reviewed and is understood.  Full responsibility of the confidentiality of this discharge information lies  with you and/or your care-partner.

## 2019-06-03 NOTE — Progress Notes (Signed)
Courtney Washington took temp and Judy Branson took vitals. 

## 2019-06-03 NOTE — Op Note (Signed)
[facility] Patient Name: Melinda Sparks Procedure Date: 06/03/2019 7:56 AM MRN: [id number] Endoscopist: Milus Banister , MD Age: 63 Referring MD:  Date of Birth: 1956/04/02 Gender: Female Account #: [id number] Procedure:                Colonoscopy Indications:              High risk colon cancer surveillance: Personal                            history of colonic polyps; colonoscopy 2015 subCM                            adenoma, also father had colon cancer (elderly) Medicines:                Monitored Anesthesia Care Procedure:                Pre-Anesthesia Assessment:                           - Prior to the procedure, a History and Physical                            was performed, and patient medications and                            allergies were reviewed. The patient's tolerance of                            previous anesthesia was also reviewed. The risks                            and benefits of the procedure and the sedation                            options and risks were discussed with the patient.                            All questions were answered, and informed consent                            was obtained. Prior Anticoagulants: The patient has                            taken no previous anticoagulant or antiplatelet                            agents. ASA Grade Assessment: II - A patient with                            mild systemic disease. After reviewing the risks                            and benefits, the patient was deemed in  satisfactory condition to undergo the procedure.                           After obtaining informed consent, the colonoscope                            was passed under direct vision. Throughout the                            procedure, the patient's blood pressure, pulse, and                            oxygen saturations were monitored continuously. The                            Colonoscope  was introduced through the anus and                            advanced to the the cecum, identified by                            appendiceal orifice and ileocecal valve. The                            colonoscopy was performed without difficulty. The                            patient tolerated the procedure well. The quality                            of the bowel preparation was good. The ileocecal                            valve, appendiceal orifice, and rectum were                            photographed (not saved due to technical issues). Findings:                 Four sessile polyps were found in the hepatic                            flexure and ascending colon. The polyps were 3 to                            10 mm in size. These polyps were removed with a                            cold snare. Resection and retrieval were complete.                           The exam was otherwise without abnormality on  direct and retroflexion views. Complications:            No immediate complications. Estimated blood loss:                            None. Estimated Blood Loss:     Estimated blood loss: none. Impression:               - Four 3 to 10 mm polyps at the hepatic flexure and                            in the ascending colon, removed with a cold snare.                            Resected and retrieved.                           - The examination was otherwise normal on direct                            and retroflexion views. Recommendation:           - Patient has a contact number available for                            emergencies. The signs and symptoms of potential                            delayed complications were discussed with the                            patient. Return to normal activities tomorrow.                            Written discharge instructions were provided to the                            patient.                           -  Resume previous diet.                           - Continue present medications.                           You will receive a letter within 2-3 weeks with the                            pathology results and my final recommendations.                           If the polyp(s) is proven to be 'pre-cancerous' on                            pathology, you will need repeat colonoscopy in 3  years. If the polyp(s) is NOT 'precancerous' on                            pathology then you should repeat colon cancer                            screening in 10 years with colonoscopy without need                            for colon cancer screening by any method prior to                            then (including stool testing). Milus Banister, MD 06/03/2019 8:02:56 AM This report has been signed electronically.

## 2019-06-03 NOTE — Progress Notes (Signed)
Pt's states no medical or surgical changes since previsit or office visit. 

## 2019-06-05 ENCOUNTER — Telehealth: Payer: Self-pay

## 2019-06-05 NOTE — Telephone Encounter (Signed)
  Follow up Call-  Call back number 06/03/2019  Post procedure Call Back phone  # [phone]  Permission to leave phone message Yes  Some recent data might be hidden     Patient questions:  Do you have a fever, pain , or abdominal swelling? No. Pain Score  0 *  Have you tolerated food without any problems? Yes.    Have you been able to return to your normal activities? Yes.    Do you have any questions about your discharge instructions: Diet   No. Medications  No. Follow up visit  No.  Do you have questions or concerns about your Care? No.  Actions: * If pain score is 4 or above: No action needed, pain <4.  1. Have you developed a fever since your procedure? no  2.   Have you had an respiratory symptoms (SOB or cough) since your procedure? no  3.   Have you tested positive for COVID 19 since your procedure no  4.   Have you had any family members/close contacts diagnosed with the COVID 19 since your procedure?  no   If yes to any of these questions please route to Joylene John, RN and Alphonsa Gin, Therapist, sports.

## 2019-06-09 ENCOUNTER — Other Ambulatory Visit: Payer: Self-pay | Admitting: Rheumatology

## 2019-06-09 ENCOUNTER — Telehealth: Payer: Self-pay | Admitting: Rheumatology

## 2019-06-09 DIAGNOSIS — M0579 Rheumatoid arthritis with rheumatoid factor of multiple sites without organ or systems involvement: Secondary | ICD-10-CM

## 2019-06-09 MED ORDER — HYDROXYCHLOROQUINE SULFATE 200 MG PO TABS: ORAL_TABLET | ORAL | 0 refills | Status: DC

## 2019-06-09 NOTE — Telephone Encounter (Signed)
Patient states due to holiday she has not received her Plaquenil, and asks for week quanity to be sent into [facility] on [address]. Patient is out, and due tomorrow. Please call to advise.

## 2019-06-09 NOTE — Telephone Encounter (Signed)
Patient advised.

## 2019-06-09 NOTE — Telephone Encounter (Signed)
Last Visit: 04/30/19 Next Visit: 09/30/19  Labs: 04/30/19 Creatinine is mildly elevated and GFR is 57. CBC WNL. PLQ Eye Exam: 11/07/18 WNL   Okay to refill per Dr. Estanislado Pandy

## 2019-06-10 ENCOUNTER — Encounter: Payer: Self-pay | Admitting: Gastroenterology

## 2019-06-16 ENCOUNTER — Telehealth: Payer: Self-pay | Admitting: Rheumatology

## 2019-06-16 DIAGNOSIS — M0579 Rheumatoid arthritis with rheumatoid factor of multiple sites without organ or systems involvement: Secondary | ICD-10-CM

## 2019-06-16 MED ORDER — HYDROXYCHLOROQUINE SULFATE 200 MG PO TABS: ORAL_TABLET | ORAL | 0 refills | Status: DC

## 2019-06-16 NOTE — Telephone Encounter (Signed)
Returned patient call.  She states she has been experiencing dizzy episodes that hit on Sunday morning.  She feels dizzy and disoriented.  She states that this happens after breakfast and taking her medications.  She noticed that a side effect of Plaquenil is dizziness and wanted to know if it could be the cause of her dizziness.  Informed patient that Plaquenil can cause dizziness but not a common side effect.  Since she has been taking Plaquenil for over a year and only happens on Sunday doubt her dizziness can be attributed to Plaquenil.   Instructed patient to follow up with PCP and continue Plaquenil at this time.  Patient verbalized understanding.  All questions encouraged and answered.  Instructed patient to call with any further questions or concerns.  Mariella Saa, PharmD, [facility] Rheumatology Clinical Pharmacist  06/16/2019 2:35 PM

## 2019-06-16 NOTE — Telephone Encounter (Signed)
Patient called stating she received a call from [facility] stating they need a new prescription for Plaquenil.  Patient states Mcarthur Rossetti has been trying to contact the office to get new prescription due to not having any refills remaining.  Patient states she had her last prescription sent to [facility] for a one time refill.  Patient requested a return call.

## 2019-06-16 NOTE — Telephone Encounter (Signed)
Patient advised would be glad to send the prescription for [facility] sent [employer] as we have not received a refill request from them.    Last Visit: 04/30/19 Next Visit: 09/30/19  Labs: 04/30/19 Creatinine is mildly elevated and GFR is 57. CBC WNL. PLQ Eye Exam: 11/07/18 WNL   Okay to refill per Dr. Estanislado Pandy   Patient states she is having dizziness and states it mainly hits on Sunday mornings. Patient states she has read that the side effect of PLQ is dizziness and wants to know if it may be because of the PLQ.

## 2019-07-22 ENCOUNTER — Other Ambulatory Visit: Payer: Medicare HMO

## 2019-07-22 ENCOUNTER — Ambulatory Visit: Payer: Medicare HMO

## 2019-08-19 ENCOUNTER — Telehealth: Payer: Self-pay | Admitting: Rheumatology

## 2019-08-19 NOTE — Telephone Encounter (Signed)
Called patient to advise her she may discontinue plaquenil to see if her symptoms resolve and if he symptoms persist then she can restart plaquenil. I advised patient that if she wants to discontinue plaquenil, she would need an appointment to discuss treatment options in the future. Patient seemed confused. Patient states she believes the levetiracetam is causing the symptoms. I advised patient that she would need to call the doctor that prescribes that medication to discuss her symptoms. Patient states she will call Dr. Joesph July.

## 2019-08-19 NOTE — Telephone Encounter (Signed)
Patient calling to discuss possible reaction she is having that could be related to generic Keppra.  Patient having dizziness, light headed, tingling sensation. Patient unable to walk correctly. PCP thinks this may be reaction to meds she takes from Dr. Estanislado Pandy. Patient requests a call back ASAP to discuss this.

## 2019-08-19 NOTE — Telephone Encounter (Signed)
Patient may discontinue Plaquenil to see if her symptoms resolve.  And if her symptoms persist then she can restart Plaquenil.  In case she wants to discontinue Plaquenil we will have to look at other treatment options and she will need appointment to discuss treatment options in the future.

## 2019-09-16 NOTE — Progress Notes (Signed)
Office Visit Note  Patient: Melinda Sparks             Date of Birth: February 18, 1956           MRN: [id number]             PCP: Bernerd Limbo, MD Referring: Bernerd Limbo, MD Visit Date: 09/30/2019 Occupation: @GUAROCC @  Subjective:  Stiffness in hands and feet    History of Present Illness: Melinda Sparks is a 63 y.o. female with history of seropositive rheumatoid arthritis.  She states she continues to have discomfort in her bilateral hands, bilateral feet and her ankles.  She has been taking Plaquenil on a daily basis.  She has not seen any joint swelling.  She continues to have some stiffness in her hands and feet.  Activities of Daily Living:  Patient reports morning stiffness for 0 minutes.   Patient Denies nocturnal pain.  Difficulty dressing/grooming: Denies Difficulty climbing stairs: Denies Difficulty getting out of chair: Denies Difficulty using hands for taps, buttons, cutlery, and/or writing: Reports  Review of Systems  Constitutional: Negative for fatigue, night sweats, weight gain and weight loss.  HENT: Negative for mouth sores, trouble swallowing, trouble swallowing, mouth dryness and nose dryness.   Eyes: Negative for pain, redness, itching, visual disturbance and dryness.  Respiratory: Negative for cough, shortness of breath and difficulty breathing.   Cardiovascular: Negative for chest pain, palpitations, hypertension, irregular heartbeat and swelling in legs/feet.  Gastrointestinal: Negative for abdominal pain, blood in stool, constipation and diarrhea.  Endocrine: Negative for increased urination.  Genitourinary: Negative for difficulty urinating, painful urination and vaginal dryness.  Musculoskeletal: Positive for arthralgias, joint pain and joint swelling. Negative for myalgias, muscle weakness, morning stiffness, muscle tenderness and myalgias.  Skin: Negative for color change, rash, hair loss, skin tightness, ulcers and sensitivity to sunlight.   Allergic/Immunologic: Negative for susceptible to infections.  Neurological: Positive for dizziness. Negative for numbness, headaches, memory loss, night sweats and weakness.  Hematological: Positive for bruising/bleeding tendency. Negative for swollen glands.  Psychiatric/Behavioral: Negative for depressed mood and sleep disturbance. The patient is not nervous/anxious.     PMFS History:  Patient Active Problem List   Diagnosis Date Noted  . Primary osteoarthritis of both feet 07/24/2017  . Primary osteoarthritis of both knees 07/24/2017  . Fracture of metacarpal neck of right hand, closed 05/01/2017  . High risk medication use 03/15/2017  . Primary osteoarthritis of both hands 03/15/2017  . Pain in right hand 03/15/2017  . Age-related osteoporosis without current pathological fracture 03/15/2017  . Rheumatoid arthritis involving multiple sites with positive rheumatoid factor + CCP MTP Erosions ([facility]) 11/13/2016    Past Medical History:  Diagnosis Date  . Allergy   . Family history of colon cancer in father   . Hyperlipidemia   . Hypothyroidism   . Rheumatoid arthritis, adult ([place])   . Seizures ([place])    epilepsy since teenager. - last seizure 3 months ago -02-2019 per husnabd at [facility] 05-20-2019  . Thyroid disease     Family History  Problem Relation Age of Onset  . Breast cancer Mother   . Colon cancer Father 1  . Colon polyps Neg Hx   . Esophageal cancer Neg Hx   . Rectal cancer Neg Hx   . Stomach cancer Neg Hx    Past Surgical History:  Procedure Laterality Date  . APPENDECTOMY    . CLOSED REDUCTION METACARPAL WITH PERCUTANEOUS PINNING Right 05/01/2017   Procedure: CLOSED REDUCTION METACARPAL  WITH PERCUTANEOUS PINNING;  Surgeon: Garald Balding, MD;  Location: [facility];  Service: Orthopedics;  Laterality: Right;  . COLONOSCOPY    . POLYPECTOMY     Social History   Social History Narrative  . Not on file   Immunization History  Administered Date(s) Administered  .  Influenza-Unspecified 09/20/2017  . Pneumococcal Polysaccharide-23 11/20/2011  . Tdap 10/09/2017     Objective: Vital Signs: BP 134/84 (BP Location: Right Arm, Patient Position: Sitting, Cuff Size: Normal)   Pulse 73   Resp 13   Ht 5\' 2"  (1.575 m)   Wt 101 lb 12.8 oz (46.2 kg)   BMI 18.62 kg/m    Physical Exam Vitals signs and nursing note reviewed.  Constitutional:      Appearance: She is well-developed.  HENT:     Head: Normocephalic and atraumatic.  Eyes:     Conjunctiva/sclera: Conjunctivae normal.  Neck:     Musculoskeletal: Normal range of motion.  Cardiovascular:     Rate and Rhythm: Normal rate and regular rhythm.     Heart sounds: Normal heart sounds.  Pulmonary:     Effort: Pulmonary effort is normal.     Breath sounds: Normal breath sounds.  Abdominal:     General: Bowel sounds are normal.     Palpations: Abdomen is soft.  Lymphadenopathy:     Cervical: No cervical adenopathy.  Skin:    General: Skin is warm and dry.     Capillary Refill: Capillary refill takes less than 2 seconds.  Neurological:     Mental Status: She is alert and oriented to person, place, and time.  Psychiatric:        Behavior: Behavior normal.      Musculoskeletal Exam: C-spine was in good range of motion.  Shoulder joints, elbow joints, wrist joints are in good range of motion.  She has MCP PIP and DIP thickening but no synovitis was noted.  Hip joints and knee joints in good range of motion with no warmth or swelling.  No MTP swelling was noted.  CDAI Exam: CDAI Score: 0.3  Patient Global: 2 mm; Provider Global: 1 mm Swollen: 0 ; Tender: 0  Joint Exam   No joint exam has been documented for this visit   There is currently no information documented on the homunculus. Go to the Rheumatology activity and complete the homunculus joint exam.  Investigation: No additional findings.  Imaging: No results found.  Recent Labs: Lab Results  Component Value Date   WBC 4.6  04/30/2019   HGB 13.0 04/30/2019   PLT 305 04/30/2019   NA 139 04/30/2019   K 4.7 04/30/2019   CL 104 04/30/2019   CO2 27 04/30/2019   GLUCOSE 82 04/30/2019   BUN 22 04/30/2019   CREATININE 1.05 (H) 04/30/2019   BILITOT 0.5 04/30/2019   ALKPHOS 34 07/26/2017   AST 26 04/30/2019   ALT 19 04/30/2019   PROT 7.2 04/30/2019   ALBUMIN 4.5 07/26/2017   CALCIUM 10.2 04/30/2019   GFRAA 66 04/30/2019    Speciality Comments: PLQ Eye Exam: 11/07/18 WNL @ [facility] follow up in 1 year Prior therapy includes: MTX (elevated creatinine)   Procedures:  No procedures performed Allergies: Felbamate, Tiagabine, and Zonisamide   Assessment / Plan:     Visit Diagnoses: Rheumatoid arthritis involving multiple sites with positive rheumatoid factor + CCP MTP Erosions ([facility])-patient is clinically doing well with no synovitis on examination today.  Although she continues to have some stiffness.  High risk medication use -    She has declined treatment with biologics in the past.  Plaquenil 200 mg 1 tablet twice daily Monday through Friday only.  Last Plaquenil eye exam normal on 11/07/2018.  Most recent CBC/CMP  Within normal limits except for mildly elevated creatinine on 04/30/2019.  Due for CBC/CMP today .Eye Exam: 11/07/18  - Plan: CBC with Differential/Platelet, COMPLETE METABOLIC PANEL WITH GFR  Primary osteoarthritis of both hands-joint protection muscle strengthening was discussed.  Primary osteoarthritis of both knees-knee joint exercises were emphasized.  Primary osteoarthritis of both feet-proper fitting shoes were discussed.  Age-related osteoporosis without current pathological fracture - Managed by Dr Coletta Memos. DEXA on 04/25/2017 BMD measured at [facility] is 0.613 g/cm2 with a T-score of -3.1. previously on Fosamax.  Patient states her bone density is a scheduled for tomorrow by her PCP  History of anxiety  History of seizure disorder  History of hyperlipidemia  Lateral  epicondylitis, left elbow  History of hypothyroidism  Noncompliance  Orders: Orders Placed This Encounter  Procedures  . CBC with Differential/Platelet  . COMPLETE METABOLIC PANEL WITH GFR   No orders of the defined types were placed in this encounter.   Follow-Up Instructions: Return in 5 months (on 02/28/2020) for Rheumatoid arthritis, Osteoarthritis, Osteoporosis.   Bo Merino, MD  Note - This record has been created using Editor, commissioning.  Chart creation errors have been sought, but may not always  have been located. Such creation errors do not reflect on  the standard of medical care.

## 2019-09-23 ENCOUNTER — Other Ambulatory Visit: Payer: Self-pay | Admitting: Rheumatology

## 2019-09-23 DIAGNOSIS — M0579 Rheumatoid arthritis with rheumatoid factor of multiple sites without organ or systems involvement: Secondary | ICD-10-CM

## 2019-09-23 NOTE — Telephone Encounter (Signed)
Last Visit: 04/30/19 Next Visit: 09/30/19  Labs: 04/30/19 creatinine is mildly elevated and GFR is 57. CBC WNL PLQ Eye Exam: 11/07/18 WNL   Okay to refill per Dr. Estanislado Pandy

## 2019-09-30 ENCOUNTER — Encounter: Payer: Self-pay | Admitting: Physician Assistant

## 2019-09-30 ENCOUNTER — Other Ambulatory Visit: Payer: Self-pay

## 2019-09-30 ENCOUNTER — Telehealth: Payer: Self-pay | Admitting: *Deleted

## 2019-09-30 ENCOUNTER — Ambulatory Visit (INDEPENDENT_AMBULATORY_CARE_PROVIDER_SITE_OTHER): Payer: Medicare HMO | Admitting: Rheumatology

## 2019-09-30 VITALS — BP 134/84 | HR 73 | Resp 13 | Ht 62.0 in | Wt 101.8 lb

## 2019-09-30 DIAGNOSIS — M0579 Rheumatoid arthritis with rheumatoid factor of multiple sites without organ or systems involvement: Secondary | ICD-10-CM

## 2019-09-30 DIAGNOSIS — Z8669 Personal history of other diseases of the nervous system and sense organs: Secondary | ICD-10-CM

## 2019-09-30 DIAGNOSIS — M19042 Primary osteoarthritis, left hand: Secondary | ICD-10-CM

## 2019-09-30 DIAGNOSIS — Z8659 Personal history of other mental and behavioral disorders: Secondary | ICD-10-CM

## 2019-09-30 DIAGNOSIS — M810 Age-related osteoporosis without current pathological fracture: Secondary | ICD-10-CM

## 2019-09-30 DIAGNOSIS — M7712 Lateral epicondylitis, left elbow: Secondary | ICD-10-CM

## 2019-09-30 DIAGNOSIS — M19071 Primary osteoarthritis, right ankle and foot: Secondary | ICD-10-CM

## 2019-09-30 DIAGNOSIS — Z79899 Other long term (current) drug therapy: Secondary | ICD-10-CM

## 2019-09-30 DIAGNOSIS — Z8639 Personal history of other endocrine, nutritional and metabolic disease: Secondary | ICD-10-CM

## 2019-09-30 DIAGNOSIS — M19041 Primary osteoarthritis, right hand: Secondary | ICD-10-CM

## 2019-09-30 DIAGNOSIS — Z9119 Patient's noncompliance with other medical treatment and regimen: Secondary | ICD-10-CM

## 2019-09-30 DIAGNOSIS — Z91199 Patient's noncompliance with other medical treatment and regimen due to unspecified reason: Secondary | ICD-10-CM

## 2019-09-30 DIAGNOSIS — M170 Bilateral primary osteoarthritis of knee: Secondary | ICD-10-CM

## 2019-09-30 MED ORDER — HYDROXYCHLOROQUINE SULFATE 200 MG PO TABS: ORAL_TABLET | ORAL | 0 refills | Status: DC

## 2019-09-30 NOTE — Telephone Encounter (Signed)
Patient contacted the office stating her prescription was sent to the mail order pharmacy. Patient needs a week worth of medication sent to her local pharmacy.

## 2019-10-01 ENCOUNTER — Ambulatory Visit
Admission: RE | Admit: 2019-10-01 | Discharge: 2019-10-01 | Disposition: A | Discharge status: Home / Self Care | Payer: Medicare HMO | Source: Ambulatory Visit | Attending: Family Medicine | Admitting: Family Medicine

## 2019-10-01 ENCOUNTER — Other Ambulatory Visit: Payer: Self-pay

## 2019-10-01 DIAGNOSIS — Z1231 Encounter for screening mammogram for malignant neoplasm of breast: Secondary | ICD-10-CM

## 2019-10-01 DIAGNOSIS — M810 Age-related osteoporosis without current pathological fracture: Secondary | ICD-10-CM

## 2019-10-01 LAB — COMPLETE METABOLIC PANEL WITH GFR
AG Ratio: 1.7 (calc) (ref 1.0–2.5)
ALT: 18 U/L (ref 6–29)
AST: 25 U/L (ref 10–35)
Albumin: 4.5 g/dL (ref 3.6–5.1)
Alkaline phosphatase (APISO): 39 U/L (ref 37–153)
BUN: 16 mg/dL (ref 7–25)
CO2: 26 mmol/L (ref 20–32)
Calcium: 10 mg/dL (ref 8.6–10.4)
Chloride: 102 mmol/L (ref 98–110)
Creat: 0.95 mg/dL (ref 0.50–0.99)
GFR, Est African American: 75 mL/min/{1.73_m2} (ref >=60)
GFR, Est Non African American: 65 mL/min/{1.73_m2} (ref >=60)
Globulin: 2.7 g/dL (calc) (ref 1.9–3.7)
Glucose, Bld: 84 mg/dL (ref 65–99)
Potassium: 5.1 mmol/L (ref 3.5–5.3)
Sodium: 138 mmol/L (ref 135–146)
Total Bilirubin: 0.5 mg/dL (ref 0.2–1.2)
Total Protein: 7.2 g/dL (ref 6.1–8.1)

## 2019-10-01 LAB — CBC WITH DIFFERENTIAL/PLATELET
Absolute Monocytes: 645 cells/uL (ref 200–950)
Basophils Absolute: 39 cells/uL (ref 0–200)
Basophils Relative: 0.9 %
Eosinophils Absolute: 39 cells/uL (ref 15–500)
Eosinophils Relative: 0.9 %
HCT: 39 % (ref 35.0–45.0)
Hemoglobin: 12.9 g/dL (ref 11.7–15.5)
Lymphs Abs: 1127 cells/uL (ref 850–3900)
MCH: 30 pg (ref 27.0–33.0)
MCHC: 33.1 g/dL (ref 32.0–36.0)
MCV: 90.7 fL (ref 80.0–100.0)
MPV: 9.5 fL (ref 7.5–12.5)
Monocytes Relative: 15 %
Neutro Abs: 2451 cells/uL (ref 1500–7800)
Neutrophils Relative %: 57 %
Platelets: 272 10*3/uL (ref 140–400)
RBC: 4.3 10*6/uL (ref 3.80–5.10)
RDW: 12.2 % (ref 11.0–15.0)
Total Lymphocyte: 26.2 %
WBC: 4.3 10*3/uL (ref 3.8–10.8)

## 2019-10-03 ENCOUNTER — Ambulatory Visit: Payer: Medicare HMO

## 2019-10-03 ENCOUNTER — Other Ambulatory Visit: Payer: Medicare HMO

## 2020-01-04 IMAGING — MG DIGITAL SCREENING BILATERAL MAMMOGRAM WITH TOMO AND CAD
6 of 12 series · 6 of 36 positions shown · non-contrast
Comparison: Previous exam(s).

CLINICAL DATA: Screening.

EXAM:
DIGITAL SCREENING BILATERAL MAMMOGRAM WITH TOMO AND CAD

[L MLO synth-2D]
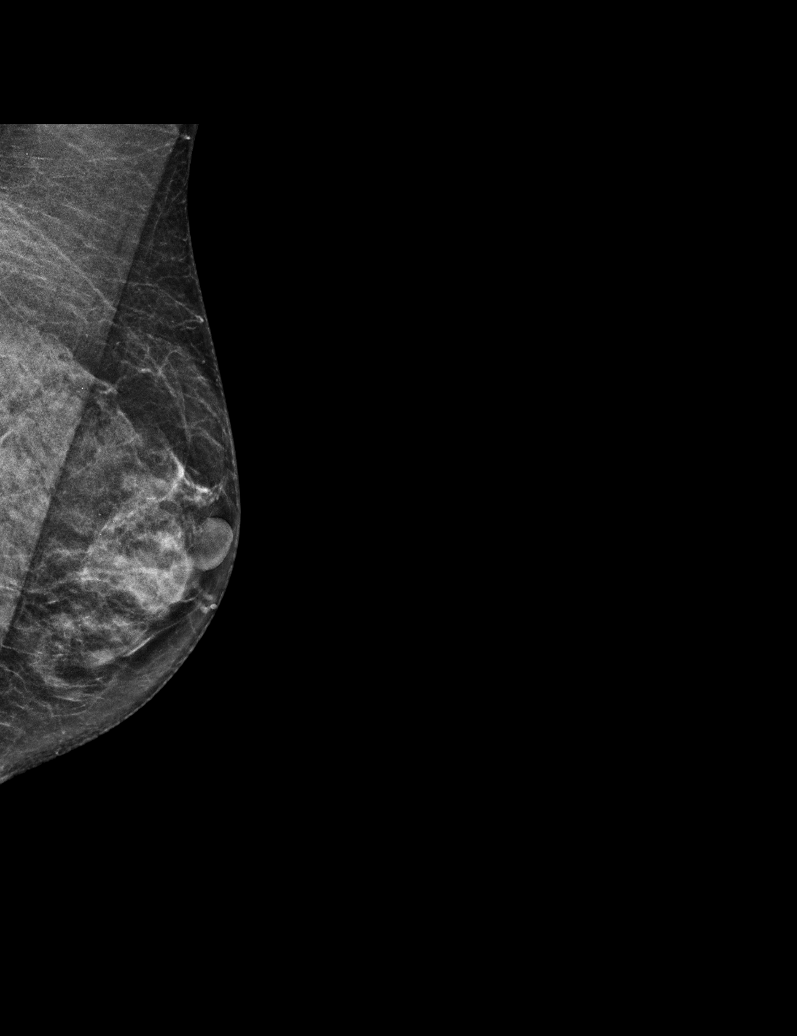

[L CC synth-2D (1 of 2)]
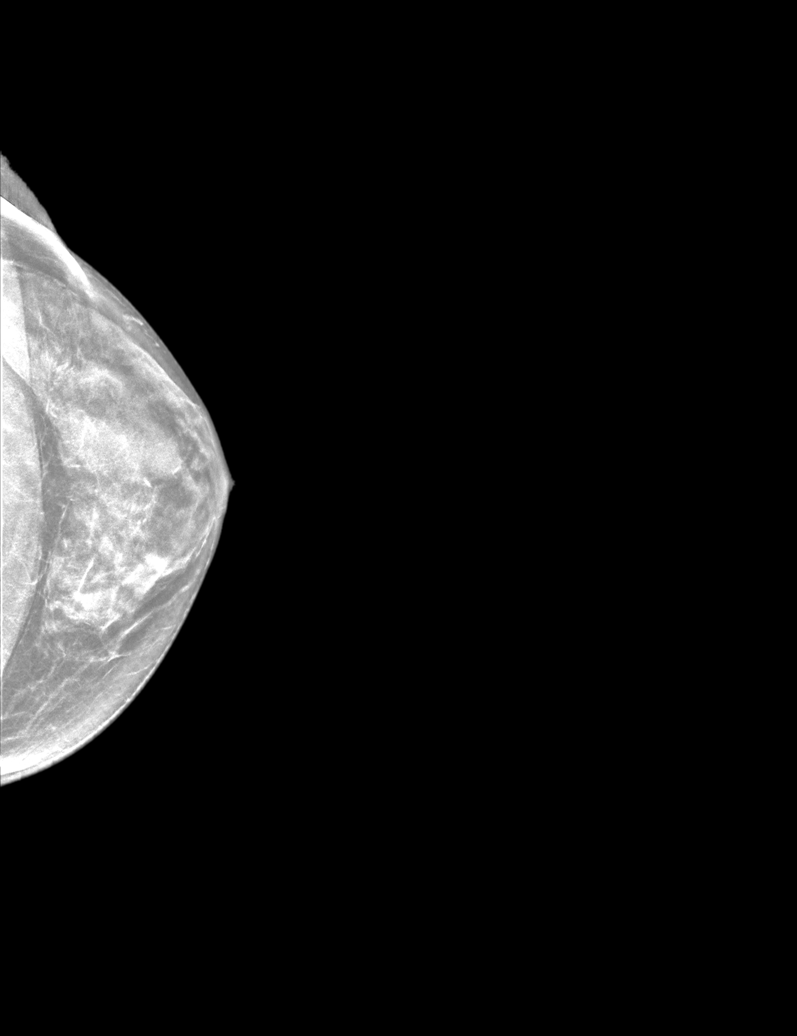

[R MLO synth-2D]
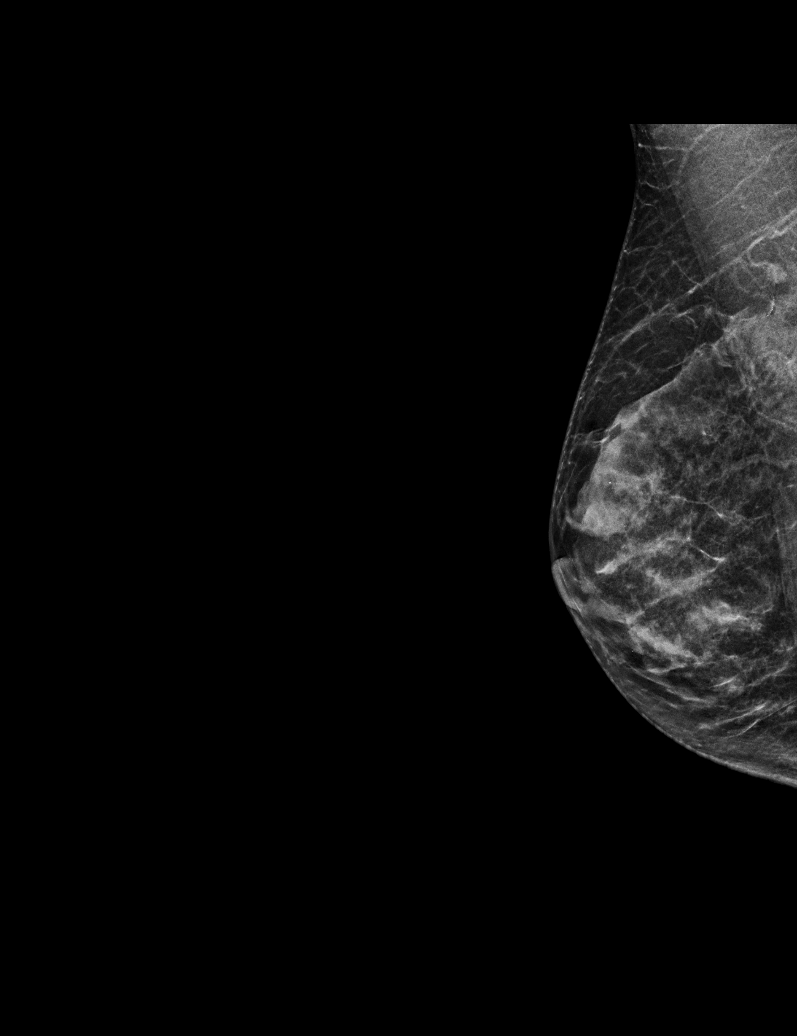

[R CC synth-2D (1 of 2)]
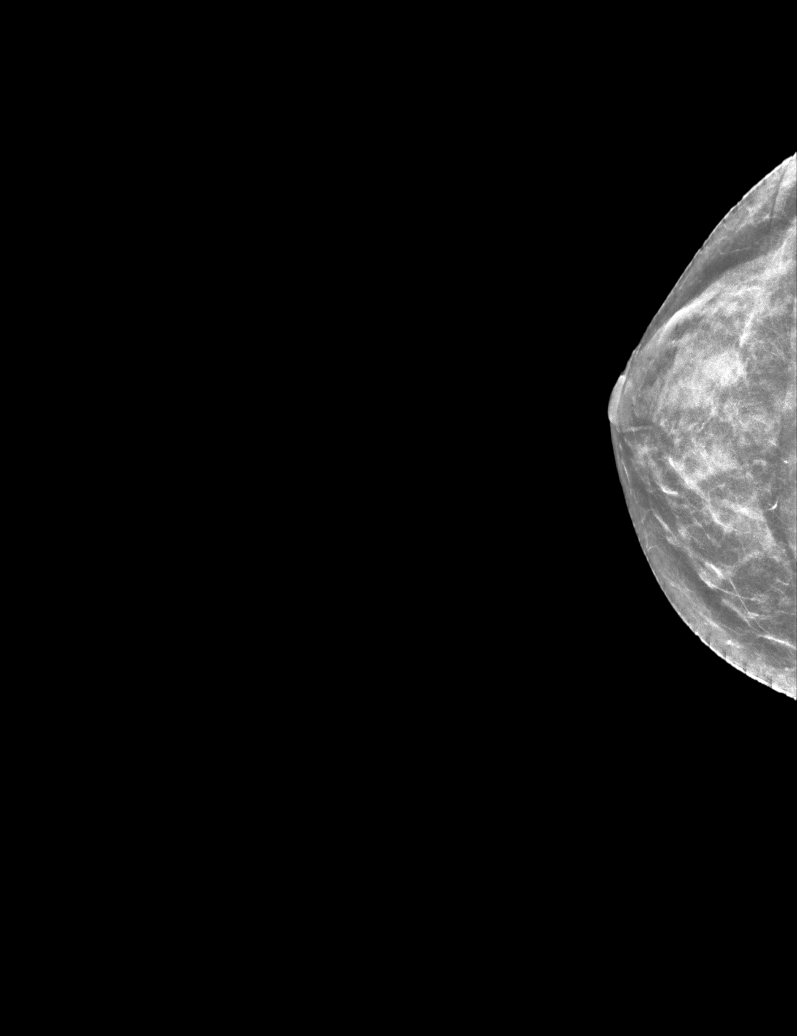

[R CC synth-2D (2 of 2)]
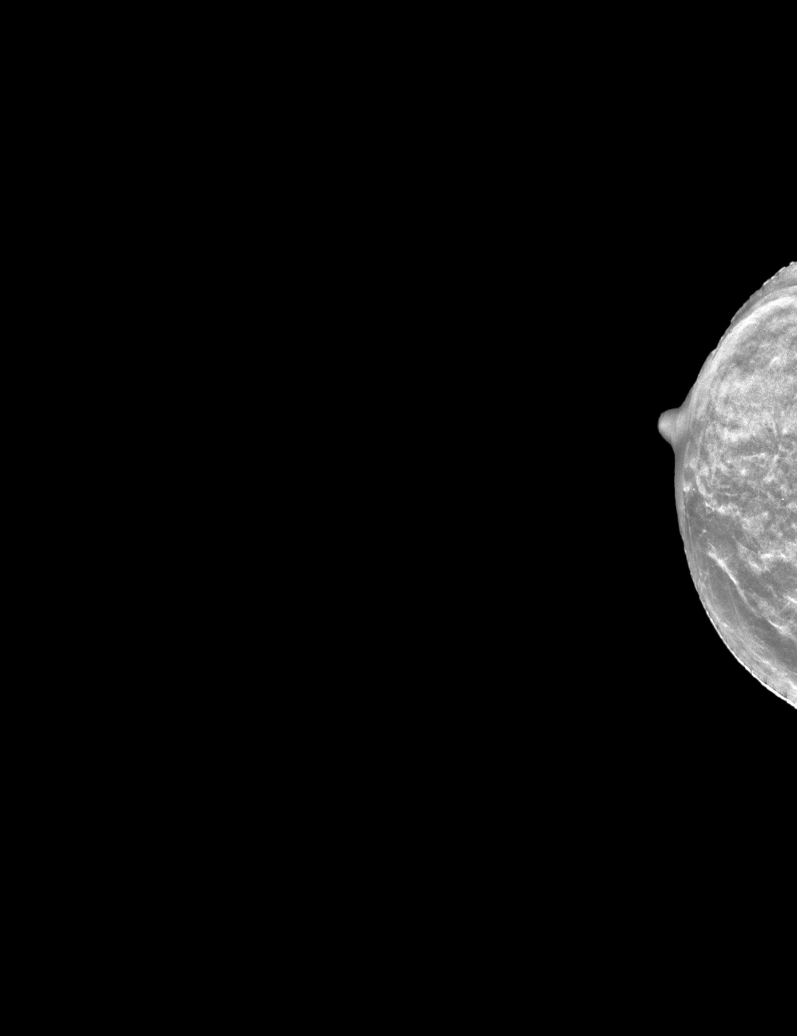

[L CC synth-2D (2 of 2)]
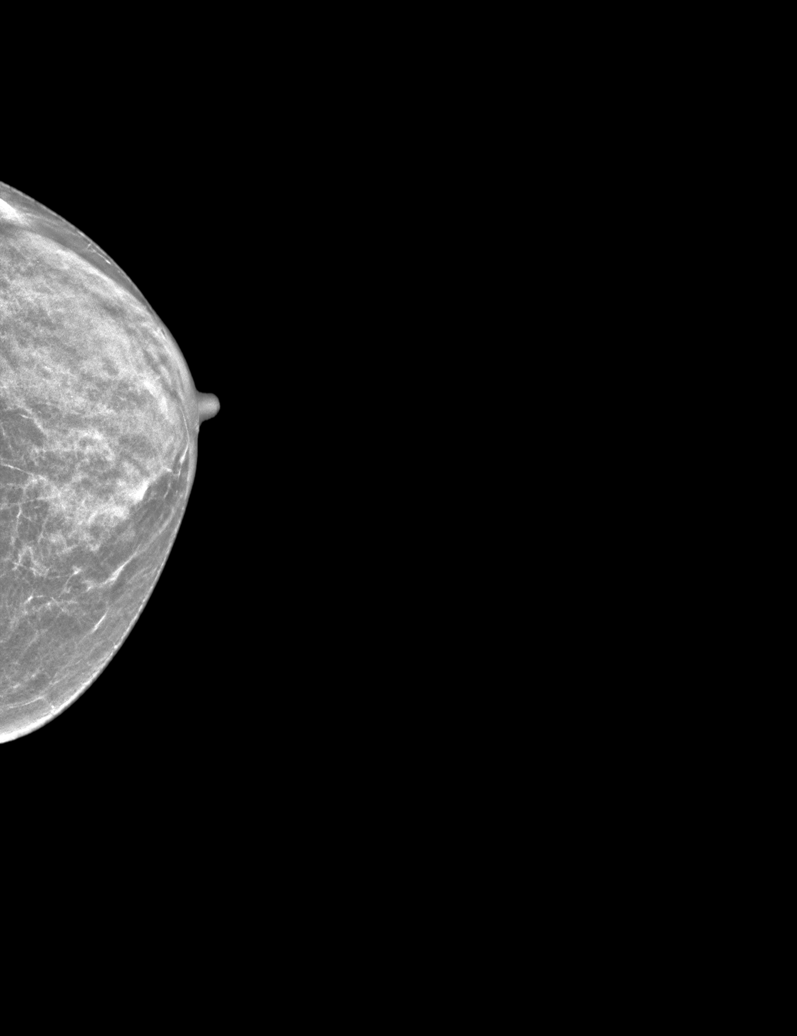

[6 of 36 positions shown; findings below may reference images not displayed]

ACR Breast Density Category c: The breast tissue is heterogeneously
dense, which may obscure small masses.
FINDINGS: There are no findings suspicious for malignancy. Images were
processed with CAD.
IMPRESSION: No mammographic evidence of malignancy. A result letter of this
screening mammogram will be mailed directly to the patient.

RECOMMENDATION:
Screening mammogram in one year. (Code:[id number]-U-LHB)

BI-RADS CATEGORY  1: Negative.

## 2020-01-07 ENCOUNTER — Other Ambulatory Visit: Payer: Self-pay | Admitting: Rheumatology

## 2020-01-07 DIAGNOSIS — M0579 Rheumatoid arthritis with rheumatoid factor of multiple sites without organ or systems involvement: Secondary | ICD-10-CM

## 2020-01-07 NOTE — Telephone Encounter (Signed)
Last Visit:09/30/2019  Next Visit: 02/24/2020 Labs: 09/30/2019 CBC and CMP WNL Eye exam: 11/11/2019   Okay to refill per Dr. Estanislado Pandy.

## 2020-02-16 NOTE — Progress Notes (Deleted)
Office Visit Note  Patient: Melinda Sparks             Date of Birth: October 21, 1956           MRN: [id number]             PCP: Bernerd Limbo, MD Referring: Bernerd Limbo, MD Visit Date: 02/24/2020 Occupation: @GUAROCC @  Subjective:  No chief complaint on file.   History of Present Illness: Melinda Sparks is a 64 y.o. female ***   Activities of Daily Living:  Patient reports morning stiffness for *** {minute/hour:19697}.   Patient {ACTIONS;DENIES/REPORTS:21021675::"Denies"} nocturnal pain.  Difficulty dressing/grooming: {ACTIONS;DENIES/REPORTS:21021675::"Denies"} Difficulty climbing stairs: {ACTIONS;DENIES/REPORTS:21021675::"Denies"} Difficulty getting out of chair: {ACTIONS;DENIES/REPORTS:21021675::"Denies"} Difficulty using hands for taps, buttons, cutlery, and/or writing: {ACTIONS;DENIES/REPORTS:21021675::"Denies"}  No Rheumatology ROS completed.   PMFS History:  Patient Active Problem List   Diagnosis Date Noted  . Primary osteoarthritis of both feet 07/24/2017  . Primary osteoarthritis of both knees 07/24/2017  . Fracture of metacarpal neck of right hand, closed 05/01/2017  . High risk medication use 03/15/2017  . Primary osteoarthritis of both hands 03/15/2017  . Pain in right hand 03/15/2017  . Age-related osteoporosis without current pathological fracture 03/15/2017  . 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 Allergy   . Family history of colon cancer in father   . Hyperlipidemia   . Hypothyroidism   . Rheumatoid arthritis, adult ([place])   . Seizures ([place])    epilepsy since teenager. - last seizure 3 months ago -02-2019 per husnabd at [facility] 05-20-2019  . Thyroid disease     Family History  Problem Relation Age of Onset  . Breast cancer Mother   . Colon cancer Father 73  . Colon polyps Neg Hx   . Esophageal cancer Neg Hx   . Rectal cancer Neg Hx    . Stomach cancer Neg Hx    Past Surgical History:  Procedure Laterality Date  . APPENDECTOMY    . CLOSED REDUCTION METACARPAL WITH PERCUTANEOUS PINNING Right 05/01/2017   Procedure: CLOSED REDUCTION METACARPAL WITH PERCUTANEOUS PINNING;  Surgeon: Garald Balding, MD;  Location: [place];  Service: Orthopedics;  Laterality: Right;  . COLONOSCOPY    . POLYPECTOMY     Social History   Social History Narrative  . Not on file   Immunization History  Administered Date(s) Administered  . Influenza-Unspecified 09/20/2017  . Pneumococcal Polysaccharide-23 11/20/2011  . Tdap 10/09/2017     Objective: Vital Signs: There were no vitals taken for this visit.   Physical Exam   Musculoskeletal Exam: ***  CDAI Exam: CDAI Score: -- Patient Global: --; Provider Global: -- Swollen: --; Tender: -- Joint Exam 02/24/2020   No joint exam has been documented for this visit   There is currently no information documented on the homunculus. Go to the Rheumatology activity and complete the homunculus joint exam.  Investigation: No additional findings.  Imaging: No results found.  Recent Labs: Lab Results  Component Value Date   WBC 4.3 09/30/2019   HGB 12.9 09/30/2019   PLT 272 09/30/2019   NA 138 09/30/2019   K 5.1 09/30/2019   CL 102 09/30/2019   CO2 26 09/30/2019   GLUCOSE 84 09/30/2019   BUN 16 09/30/2019   CREATININE 0.95 09/30/2019   BILITOT 0.5 09/30/2019   ALKPHOS 34 07/26/2017   AST 25 09/30/2019   ALT 18 09/30/2019  PROT 7.2 09/30/2019   ALBUMIN 4.5 07/26/2017   CALCIUM 10.0 09/30/2019   GFRAA 75 09/30/2019    Speciality Comments: PLQ Eye Exam: 11/11/2019 WNL @ [facility] follow up in 1 year Prior therapy includes: MTX (elevated creatinine)  Procedures:  No procedures performed Allergies: Felbamate, Tiagabine, and Zonisamide   Assessment / Plan:     Visit Diagnoses: No diagnosis found.  Orders: No orders of the defined types were placed in this  encounter.  No orders of the defined types were placed in this encounter.   Face-to-face time spent with patient was *** minutes. Greater than 50% of time was spent in counseling and coordination of care.  Follow-Up Instructions: No follow-ups on file.   Earnestine Mealing, CMA  Note - This record has been created using Editor, commissioning.  Chart creation errors have been sought, but may not always  have been located. Such creation errors do not reflect on  the standard of medical care.

## 2020-02-23 NOTE — Progress Notes (Signed)
Office Visit Note  Patient: Melinda Sparks             Date of Birth: June 11, 1956           MRN: [id number]             PCP: Bernerd Limbo, MD Referring: Bernerd Limbo, MD Visit Date: 03/02/2020 Occupation: @GUAROCC @  Subjective:  Pain in both hands and both feet  History of Present Illness: Melinda Sparks is a 64 y.o. female with history of seropositive rheumatoid arthritis and osteoarthritis.  She is taking plaquenil 200 mg 1 tablet by mouth twice daily M-F.  She has been experiencing pain in both hands, both ankle joints, and both feet.  She denies any joint swelling.   She states she has noticed decreased grip strength and has been dropping objects more.  She is taking fosamax 10 mg po daily for management of osteoporosis.   Activities of Daily Living:  Patient reports morning stiffness for 3 minutes.   Patient Reports nocturnal pain.  Difficulty dressing/grooming: Denies Difficulty climbing stairs: Denies Difficulty getting out of chair: Denies Difficulty using hands for taps, buttons, cutlery, and/or writing: Reports  Review of Systems  Constitutional: Positive for fatigue.  HENT: Positive for mouth dryness. Negative for mouth sores and nose dryness.   Eyes: Positive for dryness. Negative for pain and visual disturbance.  Respiratory: Negative for cough, hemoptysis, shortness of breath and difficulty breathing.   Cardiovascular: Negative for chest pain, palpitations, hypertension and swelling in legs/feet.  Gastrointestinal: Negative for blood in stool, constipation and diarrhea.  Endocrine: Negative for increased urination.  Genitourinary: Negative for difficulty urinating and painful urination.  Musculoskeletal: Positive for joint swelling, muscle weakness, morning stiffness and muscle tenderness. Negative for arthralgias, joint pain, myalgias and myalgias.  Skin: Negative for color change, pallor, rash, hair loss, nodules/bumps, skin tightness, ulcers and sensitivity  to sunlight.  Allergic/Immunologic: Negative for susceptible to infections.  Neurological: Positive for seizures (H/o). Negative for dizziness, numbness and headaches.  Hematological: Negative for swollen glands.  Psychiatric/Behavioral: Positive for sleep disturbance. Negative for depressed mood. The patient is not nervous/anxious.     PMFS History:  Patient Active Problem List   Diagnosis Date Noted  . Primary osteoarthritis of both feet 07/24/2017  . Primary osteoarthritis of both knees 07/24/2017  . Fracture of metacarpal neck of right hand, closed 05/01/2017  . High risk medication use 03/15/2017  . Primary osteoarthritis of both hands 03/15/2017  . Pain in right hand 03/15/2017  . Age-related osteoporosis without current pathological fracture 03/15/2017  . 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 Allergy   . Family history of colon cancer in father   . Hyperlipidemia   . Hypothyroidism   . Rheumatoid arthritis, adult ([place])   . Seizures ([place])    epilepsy since teenager. - last seizure 3 months ago -02-2019 per husnabd at [facility] 05-20-2019  . Thyroid disease     Family History  Problem Relation Age of Onset  . Breast cancer Mother   . Colon cancer Father 35  . Colon polyps Neg Hx   . Esophageal cancer Neg Hx   . Rectal cancer Neg Hx   . Stomach cancer Neg Hx    Past Surgical History:  Procedure Laterality Date  . APPENDECTOMY    . CLOSED REDUCTION METACARPAL WITH PERCUTANEOUS PINNING Right 05/01/2017   Procedure: CLOSED  REDUCTION METACARPAL WITH PERCUTANEOUS PINNING;  Surgeon: Garald Balding, MD;  Location: [place];  Service: Orthopedics;  Laterality: Right;  . COLONOSCOPY    . POLYPECTOMY     Social History   Social History Narrative  . Not on file   Immunization History  Administered Date(s) Administered  . Influenza-Unspecified 09/20/2017  .  Pneumococcal Polysaccharide-23 11/20/2011  . Tdap 10/09/2017     Objective: Vital Signs: BP 120/69 (BP Location: Left Arm, Patient Position: Sitting, Cuff Size: Normal)   Pulse 80   Resp 16   Ht 5\' 2"  (1.575 m)   Wt 102 lb 3.2 oz (46.4 kg)   BMI 18.69 kg/m    Physical Exam Vitals and nursing note reviewed.  Constitutional:      Appearance: She is well-developed.  HENT:     Head: Normocephalic and atraumatic.  Eyes:     Conjunctiva/sclera: Conjunctivae normal.  Pulmonary:     Effort: Pulmonary effort is normal.  Abdominal:     General: Bowel sounds are normal.     Palpations: Abdomen is soft.  Musculoskeletal:     Cervical back: Normal range of motion.  Lymphadenopathy:     Cervical: No cervical adenopathy.  Skin:    General: Skin is warm and dry.     Capillary Refill: Capillary refill takes less than 2 seconds.  Neurological:     Mental Status: She is alert and oriented to person, place, and time.  Psychiatric:        Behavior: Behavior normal.      Musculoskeletal Exam: C-spine, thoracic spine, and lumbar spine good ROM.  No midline spinal tenderness.  Shoulder joints, elbow joints, wrist joints, MCPs, PIPs, and DIPs good ROM with no synovitis. Bilateral 1st, 2nd, and 3rd MCP synovial thickening. PIP and DIP thickening consistent  Left 1st MCP subluxation.  Complete fist formation bilaterally.  Hip joints, knee joints, ankle joints, MTPs, PIPs, and DIPs good ROM with no synovitis.  No warmth or effusion of knee joints.  No tenderness or swelling of ankle joints.  PIP and DIP thickening consistent with osteoarthritis of both feet.   CDAI Exam: CDAI Score: -- Patient Global: --; Provider Global: -- Swollen: --; Tender: -- Joint Exam 03/02/2020   No joint exam has been documented for this visit   There is currently no information documented on the homunculus. Go to the Rheumatology activity and complete the homunculus joint exam.  Investigation: No additional  findings.  Imaging: No results found.  Recent Labs: Lab Results  Component Value Date   WBC 4.3 09/30/2019   HGB 12.9 09/30/2019   PLT 272 09/30/2019   NA 138 09/30/2019   K 5.1 09/30/2019   CL 102 09/30/2019   CO2 26 09/30/2019   GLUCOSE 84 09/30/2019   BUN 16 09/30/2019   CREATININE 0.95 09/30/2019   BILITOT 0.5 09/30/2019   ALKPHOS 34 07/26/2017   AST 25 09/30/2019   ALT 18 09/30/2019   PROT 7.2 09/30/2019   ALBUMIN 4.5 07/26/2017   CALCIUM 10.0 09/30/2019   GFRAA 75 09/30/2019    Speciality Comments: PLQ Eye Exam: 11/11/2019 WNL @ [facility] follow up in 1 year Prior therapy includes: MTX (elevated creatinine)  Procedures:  No procedures performed Allergies: Felbamate, Tiagabine, and Zonisamide   Assessment / Plan:     Visit Diagnoses: 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  She has not had any  recent rheumatoid arthritis flares.  She experiences intermittent discomfort in both hands and both feet due to underlying osteoarthritis.  She has PIP and DIP thickening consistent with osteoarthritis of both hands and both feet on exam.  She has synovial thickening of bilateral first, second, and third MCP joints but no synovitis was noted.  She has been taking Plaquenil 200 mg 1 tablet by mouth twice daily Monday through Friday.  She was advised to reduce Plaquenil to 200 mg 1 tablet daily since she is clinically been doing well.  She was advised to notify us if develops increased joint pain or joint swelling.  She will follow-up in the office in 5 months.  High risk medication use - She has declined treatment with biologics in the past.  She will be reducing Plaquenil to 200 mg 1 tablet daily since she has clinically been doing well.  CBC and CMP will be drawn today to monitor for drug toxicity.. Eye Exam: 11/11/2019 - Plan: CBC with Differential/Platelet, COMPLETE METABOLIC  PANEL WITH GFR  Primary osteoarthritis of both hands: She has PIP and DIP thickening consistent with osteoarthritis of both hands.  No tenderness or synovitis noted.  Has complete fist formation bilaterally.  She has noticed decreased grip strength and has been dropping objects more frequently.  Discussed the importance of joint protection and muscle strengthening.  She was given a handout of hand exercises to perform.  Primary osteoarthritis of both knees: She has good range of motion of both knee joints.  No warmth or effusion was noted.  She has no difficulty climbing steps or getting up from a chair due to discomfort in her knees.  Primary osteoarthritis of both feet: She has PIP and DIP thickening consistent osteoarthritis of both feet.  No tenderness or synovitis was noted.  She is able to bear weight fully.  She wears proper fitting shoes.  Age-related osteoporosis without current pathological fracture -  Followed by Dr Coletta Memos. DEXA on 04/25/2017 BMD measured at Femur Total Left is 0.613 g/cm2 with a T-score of -3.1. Most recent DEXA on 10/01/19: The BMD measured at Fem[facility] Neck Right is 0.616 g/cm2 with a T-score of -3.0. She is taking Fosamax 10 mg 1 tablet by mouth daily for management of osteoporosis.  Lateral epicondylitis, left elbow: Resolved   Other medical conditions are listed as follows:  History of hyperlipidemia  History of anxiety  History of seizure disorder  History of hypothyroidism  Noncompliance  Orders: Orders Placed This Encounter  Procedures  . CBC with Differential/Platelet  . COMPLETE METABOLIC PANEL WITH GFR   No orders of the defined types were placed in this encounter.   Follow-Up Instructions: Return in about 5 months (around 08/02/2020) for Rheumatoid arthritis, Osteoarthritis.   Ofilia Neas, PA-C   I examined and evaluated the patient with Hazel Sams PA.  Patient had no synovitis on my examination today.  We discussed decreasing Plaquenil to  1 tablet p.o. daily instead of 1 tablet p.o. twice daily Monday to Friday.  She was in agreement.  If her symptoms get worse then she will go to her previous dose.  The plan of care was discussed as noted above.  Bo Merino, MD Note - This record has been created using Editor, commissioning.  Chart creation errors have been sought, but may not always  have been located. Such creation errors do not reflect on  the standard of medical care.

## 2020-02-24 ENCOUNTER — Ambulatory Visit: Payer: Medicare HMO | Admitting: Rheumatology

## 2020-03-02 ENCOUNTER — Ambulatory Visit: Payer: Medicare HMO | Admitting: Rheumatology

## 2020-03-02 ENCOUNTER — Encounter: Payer: Self-pay | Admitting: Rheumatology

## 2020-03-02 ENCOUNTER — Other Ambulatory Visit: Payer: Self-pay

## 2020-03-02 VITALS — BP 120/69 | HR 80 | Resp 16 | Ht 62.0 in | Wt 102.2 lb

## 2020-03-02 DIAGNOSIS — Z91199 Patient's noncompliance with other medical treatment and regimen due to unspecified reason: Secondary | ICD-10-CM

## 2020-03-02 DIAGNOSIS — M19071 Primary osteoarthritis, right ankle and foot: Secondary | ICD-10-CM

## 2020-03-02 DIAGNOSIS — M170 Bilateral primary osteoarthritis of knee: Secondary | ICD-10-CM | POA: Diagnosis not present

## 2020-03-02 DIAGNOSIS — Z79899 Other long term (current) drug therapy: Secondary | ICD-10-CM | POA: Diagnosis not present

## 2020-03-02 DIAGNOSIS — Z8669 Personal history of other diseases of the nervous system and sense organs: Secondary | ICD-10-CM

## 2020-03-02 DIAGNOSIS — M19072 Primary osteoarthritis, left ankle and foot: Secondary | ICD-10-CM

## 2020-03-02 DIAGNOSIS — Z8639 Personal history of other endocrine, nutritional and metabolic disease: Secondary | ICD-10-CM

## 2020-03-02 DIAGNOSIS — Z8659 Personal history of other mental and behavioral disorders: Secondary | ICD-10-CM

## 2020-03-02 DIAGNOSIS — M19041 Primary osteoarthritis, right hand: Secondary | ICD-10-CM

## 2020-03-02 DIAGNOSIS — M7712 Lateral epicondylitis, left elbow: Secondary | ICD-10-CM

## 2020-03-02 DIAGNOSIS — M19042 Primary osteoarthritis, left hand: Secondary | ICD-10-CM

## 2020-03-02 DIAGNOSIS — M810 Age-related osteoporosis without current pathological fracture: Secondary | ICD-10-CM

## 2020-03-02 DIAGNOSIS — M0579 Rheumatoid arthritis with rheumatoid factor of multiple sites without organ or systems involvement: Secondary | ICD-10-CM

## 2020-03-02 DIAGNOSIS — Z9119 Patient's noncompliance with other medical treatment and regimen: Secondary | ICD-10-CM

## 2020-03-02 MED ORDER — HYDROXYCHLOROQUINE SULFATE 200 MG PO TABS: 200.0000 mg | ORAL_TABLET | Freq: Every day | ORAL | 0 refills | Status: DC

## 2020-03-02 NOTE — Patient Instructions (Addendum)
Reduce plaquenil to 1 tablet by mouth daily.     Standing Labs We placed an order today for your standing lab work.  Please come back and get your standing labs in 5 months   We have open lab daily Monday through Thursday from 8:30-12:30 PM and 1:30-4:30 PM and Friday from 8:30-12:30 PM and 1:30-4:00 PM at the office of Dr. Bo Merino.   You may experience shorter wait times on Monday and Friday afternoons. The office is located at [address] [place], [place], [place] [address] No appointment is necessary.   Labs are drawn by [facility].  You may receive a bill from [place] for your lab work.  If you wish to have your labs drawn at another location, please call the office 24 hours in advance to send orders.  If you have any questions regarding directions or hours of operation,  please call [phone].   Just as a reminder please drink plenty of water prior to coming for your lab work. Thanks!     Hand Exercises Hand exercises can be helpful for almost anyone. These exercises can strengthen the hands, improve flexibility and movement, and increase blood flow to the hands. These results can make work and daily tasks easier. Hand exercises can be especially helpful for people who have joint pain from arthritis or have nerve damage from overuse (carpal tunnel syndrome). These exercises can also help people who have injured a hand. Exercises Most of these hand exercises are gentle stretching and motion exercises. It is usually safe to do them often throughout the day. Warming up your hands before exercise may help to reduce stiffness. You can do this with gentle massage or by placing your hands in warm water for 10-15 minutes. It is normal to feel some stretching, pulling, tightness, or mild discomfort as you begin new exercises. This will gradually improve. Stop an exercise right away if you feel sudden, severe pain or your pain gets worse. Ask your health care provider which  exercises are best for you. Knuckle bend or "claw" fist 1. Stand or sit with your arm, hand, and all five fingers pointed straight up. Make sure to keep your wrist straight during the exercise. 2. Gently bend your fingers down toward your palm until the tips of your fingers are touching the top of your palm. Keep your big knuckle straight and just bend the small knuckles in your fingers. 3. Hold this position for __________ seconds. 4. Straighten (extend) your fingers back to the starting position. Repeat this exercise 5-10 times with each hand. Full finger fist 1. Stand or sit with your arm, hand, and all five fingers pointed straight up. Make sure to keep your wrist straight during the exercise. 2. Gently bend your fingers into your palm until the tips of your fingers are touching the middle of your palm. 3. Hold this position for __________ seconds. 4. Extend your fingers back to the starting position, stretching every joint fully. Repeat this exercise 5-10 times with each hand. Straight fist 1. Stand or sit with your arm, hand, and all five fingers pointed straight up. Make sure to keep your wrist straight during the exercise. 2. Gently bend your fingers at the big knuckle, where your fingers meet your hand, and the middle knuckle. Keep the knuckle at the tips of your fingers straight and try to touch the bottom of your palm. 3. Hold this position for __________ seconds. 4. Extend your fingers back to the starting position, stretching every  joint fully. Repeat this exercise 5-10 times with each hand. Tabletop 1. Stand or sit with your arm, hand, and all five fingers pointed straight up. Make sure to keep your wrist straight during the exercise. 2. Gently bend your fingers at the big knuckle, where your fingers meet your hand, as far down as you can while keeping the small knuckles in your fingers straight. Think of forming a tabletop with your fingers. 3. Hold this position for __________  seconds. 4. Extend your fingers back to the starting position, stretching every joint fully. Repeat this exercise 5-10 times with each hand. Finger spread 1. Place your hand flat on a table with your palm facing down. Make sure your wrist stays straight as you do this exercise. 2. Spread your fingers and thumb apart from each other as far as you can until you feel a gentle stretch. Hold this position for __________ seconds. 3. Bring your fingers and thumb tight together again. Hold this position for __________ seconds. Repeat this exercise 5-10 times with each hand. Making circles 1. Stand or sit with your arm, hand, and all five fingers pointed straight up. Make sure to keep your wrist straight during the exercise. 2. Make a circle by touching the tip of your thumb to the tip of your index finger. 3. Hold for __________ seconds. Then open your hand wide. 4. Repeat this motion with your thumb and each finger on your hand. Repeat this exercise 5-10 times with each hand. Thumb motion 1. Sit with your forearm resting on a table and your wrist straight. Your thumb should be facing up toward the ceiling. Keep your fingers relaxed as you move your thumb. 2. Lift your thumb up as high as you can toward the ceiling. Hold for __________ seconds. 3. Bend your thumb across your palm as far as you can, reaching the tip of your thumb for the small finger (pinkie) side of your palm. Hold for __________ seconds. Repeat this exercise 5-10 times with each hand. Grip strengthening  1. Hold a stress ball or other soft ball in the middle of your hand. 2. Slowly increase the pressure, squeezing the ball as much as you can without causing pain. Think of bringing the tips of your fingers into the middle of your palm. All of your finger joints should bend when doing this exercise. 3. Hold your squeeze for __________ seconds, then relax. Repeat this exercise 5-10 times with each hand. Contact a health care provider  if:  Your hand pain or discomfort gets much worse when you do an exercise.  Your hand pain or discomfort does not improve within 2 hours after you exercise. If you have any of these problems, stop doing these exercises right away. Do not do them again unless your health care provider says that you can. Get help right away if:  You develop sudden, severe hand pain or swelling. If this happens, stop doing these exercises right away. Do not do them again unless your health care provider says that you can. This information is not intended to replace advice given to you by your health care provider. Make sure you discuss any questions you have with your health care provider. Document Revised: 03/13/2019 Document Reviewed: 11/21/2018 Elsevier Patient Education  [place].

## 2020-03-03 LAB — COMPLETE METABOLIC PANEL WITH GFR
AG Ratio: 1.6 (calc) (ref 1.0–2.5)
ALT: 19 U/L (ref 6–29)
AST: 26 U/L (ref 10–35)
Albumin: 4.3 g/dL (ref 3.6–5.1)
Alkaline phosphatase (APISO): 35 U/L — ABNORMAL LOW (ref 37–153)
BUN: 14 mg/dL (ref 7–25)
CO2: 29 mmol/L (ref 20–32)
Calcium: 10.2 mg/dL (ref 8.6–10.4)
Chloride: 102 mmol/L (ref 98–110)
Creat: 0.88 mg/dL (ref 0.50–0.99)
GFR, Est African American: 82 mL/min/{1.73_m2} (ref >=60)
GFR, Est Non African American: 70 mL/min/{1.73_m2} (ref >=60)
Globulin: 2.7 g/dL (calc) (ref 1.9–3.7)
Glucose, Bld: 53 mg/dL — ABNORMAL LOW (ref 65–99)
Potassium: 4.5 mmol/L (ref 3.5–5.3)
Sodium: 138 mmol/L (ref 135–146)
Total Bilirubin: 0.4 mg/dL (ref 0.2–1.2)
Total Protein: 7 g/dL (ref 6.1–8.1)

## 2020-03-03 LAB — CBC WITH DIFFERENTIAL/PLATELET
Absolute Monocytes: 810 cells/uL (ref 200–950)
Basophils Absolute: 50 cells/uL (ref 0–200)
Basophils Relative: 1.1 %
Eosinophils Absolute: 50 cells/uL (ref 15–500)
Eosinophils Relative: 1.1 %
HCT: 38.6 % (ref 35.0–45.0)
Hemoglobin: 13 g/dL (ref 11.7–15.5)
Lymphs Abs: 1224 cells/uL (ref 850–3900)
MCH: 30.7 pg (ref 27.0–33.0)
MCHC: 33.7 g/dL (ref 32.0–36.0)
MCV: 91.3 fL (ref 80.0–100.0)
MPV: 9.3 fL (ref 7.5–12.5)
Monocytes Relative: 18 %
Neutro Abs: 2367 cells/uL (ref 1500–7800)
Neutrophils Relative %: 52.6 %
Platelets: 287 10*3/uL (ref 140–400)
RBC: 4.23 10*6/uL (ref 3.80–5.10)
RDW: 12.1 % (ref 11.0–15.0)
Total Lymphocyte: 27.2 %
WBC: 4.5 10*3/uL (ref 3.8–10.8)

## 2020-03-03 NOTE — Progress Notes (Signed)
CBC WNL.  Alk phos is low but stable. Rest of CMP WNL.

## 2020-03-26 ENCOUNTER — Other Ambulatory Visit: Payer: Self-pay | Admitting: Rheumatology

## 2020-03-26 DIAGNOSIS — M0579 Rheumatoid arthritis with rheumatoid factor of multiple sites without organ or systems involvement: Secondary | ICD-10-CM

## 2020-03-26 NOTE — Telephone Encounter (Signed)
Last Visit: 03/02/2020 °Next Visit: 08/03/2020 °Labs: 03/02/2020 CBC WNL.  Alk phos is low but stable. Rest of CMP WNL. °Eye exam: 11/11/2019  °  °Per note on 03/02/2020, She will be reducing Plaquenil to 200 mg 1 tablet daily since she has clinically been doing well. °  °Okay to refill per Dr. Deveshwar.  °

## 2020-06-23 ENCOUNTER — Other Ambulatory Visit: Payer: Self-pay | Admitting: Rheumatology

## 2020-06-23 DIAGNOSIS — M0579 Rheumatoid arthritis with rheumatoid factor of multiple sites without organ or systems involvement: Secondary | ICD-10-CM

## 2020-06-23 NOTE — Telephone Encounter (Signed)
Last Visit: 03/02/2020 °Next Visit: 08/03/2020 °Labs: 03/02/2020 CBC WNL.  Alk phos is low but stable. Rest of CMP WNL. °Eye exam: 11/11/2019  °  °Per note on 03/02/2020, She will be reducing Plaquenil to 200 mg 1 tablet daily since she has clinically been doing well. °  °Okay to refill per Dr. Deveshwar.  °

## 2020-07-22 NOTE — Progress Notes (Signed)
Office Visit Note  Patient: Melinda Sparks             Date of Birth: 05-Apr-1956           MRN: [id number]             PCP: Bernerd Limbo, MD Referring: Bernerd Limbo, MD Visit Date: 08/03/2020 Occupation: @GUAROCC @  Subjective:  Right hand pain   History of Present Illness: Melinda Sparks is a 64 y.o. female with history of seropositive rheumatoid arthritis, osteoarthritis, and osteoporosis.  Patient is taking Plaquenil 200 mg 1 tablet by mouth daily.  She is tolerating Plaquenil without any side effects.  She has not had any recent infections.  She received the [place] vaccination on 02/15/2020. Patient reports that she continues to have chronic pain in the right hand and both feet.  She states that she has intermittent swelling in the right hand.  She has been trying to perform hand exercises on a regular basis. She takes Fosamax 70 mg 1 tablet by mouth once weekly for the management of osteoporosis.  She denies any recent falls or fractures.  Activities of Daily Living:  Patient reports morning stiffness for 0 minutes.   Patient Denies nocturnal pain.  Difficulty dressing/grooming: Denies Difficulty climbing stairs: Denies Difficulty getting out of chair: Denies Difficulty using hands for taps, buttons, cutlery, and/or writing: Reports  Review of Systems  Constitutional: Negative for fatigue.  HENT: Positive for nose dryness. Negative for mouth sores and mouth dryness.   Eyes: Positive for dryness.  Respiratory: Negative for shortness of breath and difficulty breathing.   Cardiovascular: Negative for chest pain and palpitations.  Gastrointestinal: Negative for blood in stool, constipation and diarrhea.  Endocrine: Negative for increased urination.  Genitourinary: Negative for difficulty urinating.  Musculoskeletal: Positive for arthralgias, joint pain and joint swelling. Negative for myalgias, morning stiffness, muscle tenderness and myalgias.  Skin:  Negative for color change, rash and redness.  Allergic/Immunologic: Negative for susceptible to infections.  Neurological: Positive for dizziness. Negative for numbness, headaches and weakness.  Hematological: Positive for bruising/bleeding tendency.  Psychiatric/Behavioral: Negative for sleep disturbance.    PMFS History:  Patient Active Problem List   Diagnosis Date Noted  . Primary osteoarthritis of both feet 07/24/2017  . Primary osteoarthritis of both knees 07/24/2017  . Fracture of metacarpal neck of right hand, closed 05/01/2017  . High risk medication use 03/15/2017  . Primary osteoarthritis of both hands 03/15/2017  . Pain in right hand 03/15/2017  . Age-related osteoporosis without current pathological fracture 03/15/2017  . Rheumatoid arthritis involving multiple sites with positive rheumatoid factor + CCP MTP Erosions ([place]) 11/13/2016    Past Medical History:  Diagnosis Date  . Allergy   . Family history of colon cancer in father   . Hyperlipidemia   . Hypothyroidism   . Rheumatoid arthritis, adult ([place])   . Seizures ([place])    epilepsy since teenager. - last seizure 3 months ago -02-2019 per husnabd at [facility] 05-20-2019  . Thyroid disease     Family History  Problem Relation Age of Onset  . Breast cancer Mother   . Colon cancer Father 22  . Colon polyps Neg Hx   . Esophageal cancer Neg Hx   . Rectal cancer Neg Hx   . Stomach cancer Neg Hx    Past Surgical History:  Procedure Laterality Date  . APPENDECTOMY    . CLOSED REDUCTION METACARPAL WITH PERCUTANEOUS PINNING Right 05/01/2017   Procedure:  CLOSED REDUCTION METACARPAL WITH PERCUTANEOUS PINNING;  Surgeon: Garald Balding, MD;  Location: [facility];  Service: Orthopedics;  Laterality: Right;  . COLONOSCOPY    . POLYPECTOMY     Social History   Social History Narrative  . Not on file   Immunization History  Administered Date(s) Administered  . Influenza-Unspecified 09/20/2017  . Janssen (J&J) SARS-COV-2  Vaccination 02/15/2020  . Pneumococcal Polysaccharide-23 11/20/2011  . Tdap 10/09/2017     Objective: Vital Signs: BP 126/70 (BP Location: Left Arm, Patient Position: Sitting, Cuff Size: Normal)   Pulse 82   Resp 14   Ht 5\' 4"  (1.626 m)   Wt 104 lb (47.2 kg)   BMI 17.85 kg/m    Physical Exam Vitals and nursing note reviewed.  Constitutional:      Appearance: She is well-developed.  HENT:     Head: Normocephalic and atraumatic.  Eyes:     Conjunctiva/sclera: Conjunctivae normal.  Pulmonary:     Effort: Pulmonary effort is normal.  Abdominal:     Palpations: Abdomen is soft.  Musculoskeletal:     Cervical back: Normal range of motion.  Lymphadenopathy:     Cervical: No cervical adenopathy.  Skin:    General: Skin is warm and dry.     Capillary Refill: Capillary refill takes less than 2 seconds.  Neurological:     Mental Status: She is alert and oriented to person, place, and time.  Psychiatric:        Behavior: Behavior normal.      Musculoskeletal Exam:  C-spine, thoracic spine, and lumbar spine good ROM.  Shoulder joints, elbow joints, wrist joints, MCPs, PIPs, and DIPs good ROM with no synovitis.  PIP and DIP thickening consistent with osteoarthritis of both hands.  Prominence of both CMC joints.  Thickening and ulnar deviation of the right 2nd and 3rd MCP joints.  Complete fist formation bilaterally.  Hip joints, knee joints, ankle joints, MTPs, PIPs, and DIPs good ROM with no synovitis.  No warmth or effusion of knee joints.  No tenderness or swelling of ankle joints.  Thickening of both 1st and 5th MTP joints.  PIP and DIP thickening consistent with osteoarthritis of both feet.   CDAI Exam: CDAI Score: 0.6  Patient Global: 4 mm; Provider Global: 2 mm Swollen: 0 ; Tender: 0  Joint Exam 08/03/2020   No joint exam has been documented for this visit   There is currently no information documented on the homunculus. Go to the Rheumatology activity and complete the  homunculus joint exam.  Investigation: No additional findings.  Imaging: XR Foot 2 Views Left  Result Date: 08/03/2020 Juxta-articular osteopenia was noted.  First MTP, PIP and DIP narrowing was noted.  Fifth MTP erosive changes noted.  No intertarsal or tibiotalar joint space narrowing was noted.  No subtalar joint space narrowing was noted.  Dorsal spurring was noted. Impression: These findings are consistent with erosive rheumatoid arthritis and osteoarthritis overlap.  XR Foot 2 Views Right  Result Date: 08/03/2020 Juxta-articular osteopenia was noted.  First MTP, PIP and DIP narrowing was noted.  Erosive changes were noted in the third and fifth MTP joints.  Callus formation was noted in the fourth metatarsal.  No intertarsal or tibiotalar joint space narrowing was noted.  No tibiotalar possible subtalar joint space narrowing was noted. Impression: These findings are consistent with erosive rheumatoid arthritis and osteoarthritis overlap.  XR Hand 2 View Left  Result Date: 08/03/2020 Juxta-articular osteopenia was noted.  Narrowing of all  MCP joints was noted.  PIP and DIP narrowing was noted.  Narrowing of intercarpal or radiocarpal joints was noted.  Erosive versus cystic changes were noted in the carpal bones.  No comparison was available. Impression: These findings are consistent with rheumatoid arthritis and osteoarthritis overlap.  XR Hand 2 View Right  Result Date: 08/03/2020 Juxta-articular osteopenia was noted.  Narrowing of all MCP joints was noted.  PIP and DIP narrowing was noted.  No intercarpal radiocarpal joint space narrowing was noted.  The cortical defect in the fifth metatarsal shaft has healed.  No intercarpal radiocarpal joint space narrowing was noted.  No erosive changes were noted.  No progression was noted when compared to the x-rays of 2018. Impression: These findings are consistent with rheumatoid arthritis and osteoarthritis overlap.   Recent Labs: Lab Results   Component Value Date   WBC 4.5 03/02/2020   HGB 13.0 03/02/2020   PLT 287 03/02/2020   NA 138 03/02/2020   K 4.5 03/02/2020   CL 102 03/02/2020   CO2 29 03/02/2020   GLUCOSE 53 (L) 03/02/2020   BUN 14 03/02/2020   CREATININE 0.88 03/02/2020   BILITOT 0.4 03/02/2020   ALKPHOS 34 07/26/2017   AST 26 03/02/2020   ALT 19 03/02/2020   PROT 7.0 03/02/2020   ALBUMIN 4.5 07/26/2017   CALCIUM 10.2 03/02/2020   GFRAA 82 03/02/2020    Speciality Comments: PLQ Eye Exam: 11/11/2019 WNL @ [facility] follow up in 1 year Prior therapy includes: MTX (elevated creatinine)  Procedures:  No procedures performed Allergies: Felbamate, Tiagabine, and Zonisamide   Assessment / Plan:     Visit Diagnoses: Rheumatoid arthritis involving multiple sites with positive rheumatoid factor + CCP MTP Erosions ([place]) -She has no joint tenderness or synovitis on exam.  She has synovial thickening of the right second and third MCP joints.  She continues to have intermittent pain in the right hand and both feet due to underlying osteoarthritis.  She has PIP and DIP thickening consistent with osteoarthritis of both hands and both feet.  X-rays of both hands and feet were updated today. X-rays of both hands did not show any radiographic progression when compared to images from 2018.  Overall her rheumatoid arthritis has been well controlled taking Plaquenil 200 mg 1 tablet by mouth daily.  She continues to tolerate Plaquenil without any side effects.  She will continue taking Plaquenil as prescribed.  She was advised to notify us if she develops increased joint pain or joint swelling.  She will follow-up in the office in 5 months.  Plan: XR Hand 2 View Right, XR Hand 2 View Left, XR Foot 2 Views Right, XR Foot 2 Views Left  High risk medication use - She has declined treatment with biologics in the past. She takes Plaquenil  200 mg 1 tablet by mouth daily. PLQ Eye Exam: 11/11/2019. She is due to update PLQ eye exam.   CBC and CMP were drawn on 03/02/2020.  She is overdue to update lab work today.  Orders were released.  She is aware that she will require updated lab work every 5 months to monitor for drug toxicity.- Plan: COMPLETE METABOLIC PANEL WITH GFR, CBC with Differential/Platelet She received the [place] vaccination on 02/15/2020. She was advised to receive the booster dose once available.  She was advised to notify us or her PCP if she develops the COVID-19 infection in order to receive the antibody infusion.  She was encouraged to continue  to wear a mask and social distance.  She voiced understanding.  Primary osteoarthritis of both hands: She has PIP and DIP thickening consistent with osteoarthritis of both hands.  CMC joint prominence noted bilaterally.  No synovitis noted.  She has complete fist formation bilaterally.  We discussed the importance of joint protection and muscle strengthening.  She was given a handout of hand exercises to perform.  Primary osteoarthritis of both knees: She has good range of motion of both knee joints on exam.  No warmth or effusion was noted.  Primary osteoarthritis of both feet: She has PIP and DIP thickening consistent with osteoarthritis of both feet.  She has thickening of bilateral first and fifth MTP joints.  We discussed the importance of wearing wide shoes with a large toe box.  She plans on continuing to wear toe spacers to help with the overcrowding of toes.  Age-related osteoporosis without current pathological fracture - DEXA on 10/01/19: The BMD measured at Femur Neck Right is 0.616 g/cm2 with a T-score of -3.0. She is taking Fosamax 10 mg 1 tablet by mouth daily.  She is taking a calcium supplement.   Lateral epicondylitis, left elbow - Resolved   Other medical conditions are listed as follows:   History of anxiety  History of hyperlipidemia  History of hypothyroidism  History of seizure disorder  Orders: Orders Placed This  Encounter  Procedures  . XR Hand 2 View Right  . XR Hand 2 View Left  . XR Foot 2 Views Right  . XR Foot 2 Views Left  . COMPLETE METABOLIC PANEL WITH GFR  . CBC with Differential/Platelet   No orders of the defined types were placed in this encounter.    Follow-Up Instructions: Return in about 5 months (around 01/03/2021) for Rheumatoid arthritis, Osteoarthritis.   Ofilia Neas, PA-C  Note - This record has been created using Dragon software.  Chart creation errors have been sought, but may not always  have been located. Such creation errors do not reflect on  the standard of medical care.

## 2020-08-03 ENCOUNTER — Ambulatory Visit: Payer: Self-pay

## 2020-08-03 ENCOUNTER — Ambulatory Visit (INDEPENDENT_AMBULATORY_CARE_PROVIDER_SITE_OTHER): Payer: Medicare HMO | Admitting: Physician Assistant

## 2020-08-03 ENCOUNTER — Encounter: Payer: Self-pay | Admitting: Rheumatology

## 2020-08-03 ENCOUNTER — Other Ambulatory Visit: Payer: Self-pay

## 2020-08-03 ENCOUNTER — Ambulatory Visit: Payer: Medicare HMO

## 2020-08-03 VITALS — BP 126/70 | HR 82 | Resp 14 | Ht 64.0 in | Wt 104.0 lb

## 2020-08-03 DIAGNOSIS — Z8639 Personal history of other endocrine, nutritional and metabolic disease: Secondary | ICD-10-CM

## 2020-08-03 DIAGNOSIS — M19071 Primary osteoarthritis, right ankle and foot: Secondary | ICD-10-CM

## 2020-08-03 DIAGNOSIS — Z9119 Patient's noncompliance with other medical treatment and regimen: Secondary | ICD-10-CM

## 2020-08-03 DIAGNOSIS — M19042 Primary osteoarthritis, left hand: Secondary | ICD-10-CM

## 2020-08-03 DIAGNOSIS — M0579 Rheumatoid arthritis with rheumatoid factor of multiple sites without organ or systems involvement: Secondary | ICD-10-CM

## 2020-08-03 DIAGNOSIS — M19072 Primary osteoarthritis, left ankle and foot: Secondary | ICD-10-CM

## 2020-08-03 DIAGNOSIS — Z8669 Personal history of other diseases of the nervous system and sense organs: Secondary | ICD-10-CM

## 2020-08-03 DIAGNOSIS — M810 Age-related osteoporosis without current pathological fracture: Secondary | ICD-10-CM

## 2020-08-03 DIAGNOSIS — Z79899 Other long term (current) drug therapy: Secondary | ICD-10-CM

## 2020-08-03 DIAGNOSIS — M170 Bilateral primary osteoarthritis of knee: Secondary | ICD-10-CM | POA: Diagnosis not present

## 2020-08-03 DIAGNOSIS — Z8659 Personal history of other mental and behavioral disorders: Secondary | ICD-10-CM

## 2020-08-03 DIAGNOSIS — M19041 Primary osteoarthritis, right hand: Secondary | ICD-10-CM | POA: Diagnosis not present

## 2020-08-03 DIAGNOSIS — M7712 Lateral epicondylitis, left elbow: Secondary | ICD-10-CM

## 2020-08-03 NOTE — Patient Instructions (Signed)
COVID-19 vaccine recommendations:   COVID-19 vaccine is recommended for everyone (unless you are allergic to a vaccine component), even if you are on a medication that suppresses your immune system.   If you are on Methotrexate, Cellcept (mycophenolate), Rinvoq, Morrie Sheldon, and Olumiant- hold the medication for 1 week after each vaccine. Hold Methotrexate for 2 weeks after the single dose COVID-19 vaccine.   If you are on Orencia subcutaneous injection - hold medication one week prior to and one week after the first COVID-19 vaccine dose (only).   If you are on Orencia IV infusions- time vaccination administration so that the first COVID-19 vaccination will occur four weeks after the infusion and postpone the subsequent infusion by one week.   If you are on Cyclophosphamide or Rituxan infusions please contact your doctor prior to receiving the COVID-19 vaccine.   Do not take Tylenol or any anti-inflammatory medications (NSAIDs) 24 hours prior to the COVID-19 vaccination.   There is no direct evidence about the efficacy of the COVID-19 vaccine in individuals who are on medications that suppress the immune system.   Even if you are fully vaccinated, and you are on any medications that suppress your immune system, please continue to wear a mask, maintain at least six feet social distance and practice hand hygiene.   If you develop a COVID-19 infection, please contact your PCP or our office to determine if you need antibody infusion.  The booster vaccine is now available for immunocompromised patients. It is advised that if you had Pfizer vaccine you should get Coca[employer]Cola booster.  If you had a Moderna vaccine then you should get a Moderna booster. Johnson and Wynetta Emery does not have a booster vaccine at this time.  Please see the following web sites for updated information.    https://www.rheumatology.org/Portals/0/Files/COVID-19-Vaccination-Patient-Resources.pdf  https://www.rheumatology.org/About-Us/Newsroom/Press-Releases/ID/1159   Hand Exercises Hand exercises can be helpful for almost anyone. These exercises can strengthen the hands, improve flexibility and movement, and increase blood flow to the hands. These results can make work and daily tasks easier. Hand exercises can be especially helpful for people who have joint pain from arthritis or have nerve damage from overuse (carpal tunnel syndrome). These exercises can also help people who have injured a hand. Exercises Most of these hand exercises are gentle stretching and motion exercises. It is usually safe to do them often throughout the day. Warming up your hands before exercise may help to reduce stiffness. You can do this with gentle massage or by placing your hands in warm water for 10-15 minutes. It is normal to feel some stretching, pulling, tightness, or mild discomfort as you begin new exercises. This will gradually improve. Stop an exercise right away if you feel sudden, severe pain or your pain gets worse. Ask your health care provider which exercises are best for you. Knuckle bend or "claw" fist 1. Stand or sit with your arm, hand, and all five fingers pointed straight up. Make sure to keep your wrist straight during the exercise. 2. Gently bend your fingers down toward your palm until the tips of your fingers are touching the top of your palm. Keep your big knuckle straight and just bend the small knuckles in your fingers. 3. Hold this position for __________ seconds. 4. Straighten (extend) your fingers back to the starting position. Repeat this exercise 5-10 times with each hand. Full finger fist 1. Stand or sit with your arm, hand, and all five fingers pointed straight up. Make sure to keep your wrist straight during the  exercise. 2. Gently bend your fingers into your palm until the tips of  your fingers are touching the middle of your palm. 3. Hold this position for __________ seconds. 4. Extend your fingers back to the starting position, stretching every joint fully. Repeat this exercise 5-10 times with each hand. Straight fist 1. Stand or sit with your arm, hand, and all five fingers pointed straight up. Make sure to keep your wrist straight during the exercise. 2. Gently bend your fingers at the big knuckle, where your fingers meet your hand, and the middle knuckle. Keep the knuckle at the tips of your fingers straight and try to touch the bottom of your palm. 3. Hold this position for __________ seconds. 4. Extend your fingers back to the starting position, stretching every joint fully. Repeat this exercise 5-10 times with each hand. Tabletop 1. Stand or sit with your arm, hand, and all five fingers pointed straight up. Make sure to keep your wrist straight during the exercise. 2. Gently bend your fingers at the big knuckle, where your fingers meet your hand, as far down as you can while keeping the small knuckles in your fingers straight. Think of forming a tabletop with your fingers. 3. Hold this position for __________ seconds. 4. Extend your fingers back to the starting position, stretching every joint fully. Repeat this exercise 5-10 times with each hand. Finger spread 1. Place your hand flat on a table with your palm facing down. Make sure your wrist stays straight as you do this exercise. 2. Spread your fingers and thumb apart from each other as far as you can until you feel a gentle stretch. Hold this position for __________ seconds. 3. Bring your fingers and thumb tight together again. Hold this position for __________ seconds. Repeat this exercise 5-10 times with each hand. Making circles 1. Stand or sit with your arm, hand, and all five fingers pointed straight up. Make sure to keep your wrist straight during the exercise. 2. Make a circle by touching the tip of  your thumb to the tip of your index finger. 3. Hold for __________ seconds. Then open your hand wide. 4. Repeat this motion with your thumb and each finger on your hand. Repeat this exercise 5-10 times with each hand. Thumb motion 1. Sit with your forearm resting on a table and your wrist straight. Your thumb should be facing up toward the ceiling. Keep your fingers relaxed as you move your thumb. 2. Lift your thumb up as high as you can toward the ceiling. Hold for __________ seconds. 3. Bend your thumb across your palm as far as you can, reaching the tip of your thumb for the small finger (pinkie) side of your palm. Hold for __________ seconds. Repeat this exercise 5-10 times with each hand. Grip strengthening  1. Hold a stress ball or other soft ball in the middle of your hand. 2. Slowly increase the pressure, squeezing the ball as much as you can without causing pain. Think of bringing the tips of your fingers into the middle of your palm. All of your finger joints should bend when doing this exercise. 3. Hold your squeeze for __________ seconds, then relax. Repeat this exercise 5-10 times with each hand. Contact a health care provider if:  Your hand pain or discomfort gets much worse when you do an exercise.  Your hand pain or discomfort does not improve within 2 hours after you exercise. If you have any of these problems, stop doing these exercises  right away. Do not do them again unless your health care provider says that you can. Get help right away if:  You develop sudden, severe hand pain or swelling. If this happens, stop doing these exercises right away. Do not do them again unless your health care provider says that you can. This information is not intended to replace advice given to you by your health care provider. Make sure you discuss any questions you have with your health care provider. Document Revised: 03/13/2019 Document Reviewed: 11/21/2018 Elsevier Patient Education   [place].

## 2020-08-04 LAB — COMPLETE METABOLIC PANEL WITH GFR
AG Ratio: 1.7 (calc) (ref 1.0–2.5)
ALT: 16 U/L (ref 6–29)
AST: 22 U/L (ref 10–35)
Albumin: 4.5 g/dL (ref 3.6–5.1)
Alkaline phosphatase (APISO): 34 U/L — ABNORMAL LOW (ref 37–153)
BUN: 10 mg/dL (ref 7–25)
CO2: 29 mmol/L (ref 20–32)
Calcium: 10 mg/dL (ref 8.6–10.4)
Chloride: 102 mmol/L (ref 98–110)
Creat: 0.94 mg/dL (ref 0.50–0.99)
GFR, Est African American: 75 mL/min/{1.73_m2} (ref >=60)
GFR, Est Non African American: 65 mL/min/{1.73_m2} (ref >=60)
Globulin: 2.6 g/dL (calc) (ref 1.9–3.7)
Glucose, Bld: 56 mg/dL — ABNORMAL LOW (ref 65–99)
Potassium: 4.4 mmol/L (ref 3.5–5.3)
Sodium: 140 mmol/L (ref 135–146)
Total Bilirubin: 0.3 mg/dL (ref 0.2–1.2)
Total Protein: 7.1 g/dL (ref 6.1–8.1)

## 2020-08-04 LAB — CBC WITH DIFFERENTIAL/PLATELET
Absolute Monocytes: 545 cells/uL (ref 200–950)
Basophils Absolute: 60 cells/uL (ref 0–200)
Basophils Relative: 1.2 %
Eosinophils Absolute: 60 cells/uL (ref 15–500)
Eosinophils Relative: 1.2 %
HCT: 38.9 % (ref 35.0–45.0)
Hemoglobin: 13.1 g/dL (ref 11.7–15.5)
Lymphs Abs: 995 cells/uL (ref 850–3900)
MCH: 30.6 pg (ref 27.0–33.0)
MCHC: 33.7 g/dL (ref 32.0–36.0)
MCV: 90.9 fL (ref 80.0–100.0)
MPV: 9.2 fL (ref 7.5–12.5)
Monocytes Relative: 10.9 %
Neutro Abs: 3340 cells/uL (ref 1500–7800)
Neutrophils Relative %: 66.8 %
Platelets: 287 10*3/uL (ref 140–400)
RBC: 4.28 10*6/uL (ref 3.80–5.10)
RDW: 12 % (ref 11.0–15.0)
Total Lymphocyte: 19.9 %
WBC: 5 10*3/uL (ref 3.8–10.8)

## 2020-08-04 NOTE — Progress Notes (Signed)
Glucose is 56. Alk phos is low but stable.  Rest of CMP WNL.  CBC WNL.

## 2020-08-24 ENCOUNTER — Other Ambulatory Visit: Payer: Self-pay | Admitting: Family Medicine

## 2020-08-24 DIAGNOSIS — Z1231 Encounter for screening mammogram for malignant neoplasm of breast: Secondary | ICD-10-CM

## 2020-09-09 ENCOUNTER — Other Ambulatory Visit: Payer: Self-pay | Admitting: Rheumatology

## 2020-09-09 DIAGNOSIS — M0579 Rheumatoid arthritis with rheumatoid factor of multiple sites without organ or systems involvement: Secondary | ICD-10-CM

## 2020-09-09 NOTE — Telephone Encounter (Signed)
Last Visit: 08/03/2020 Next Visit: 01/04/2021 Labs: 08/03/2020 Glucose is 56. Alk phos is low but stable. Rest of CMP WNL. CBC WNL. Eye exam: 11/11/2019 WNL  Current Dose per office note 08/03/2020: Plaquenil  200 mg 1 tablet by mouth daily DX: Rheumatoid arthritis involving multiple sites with positive rheumatoid factor + CCP MTP Erosions   Okay to refill Plaquenil?

## 2020-10-01 ENCOUNTER — Ambulatory Visit: Payer: Medicare HMO

## 2020-11-04 ENCOUNTER — Ambulatory Visit: Payer: Medicare HMO

## 2020-11-06 ENCOUNTER — Other Ambulatory Visit: Payer: Self-pay | Admitting: Physician Assistant

## 2020-11-06 DIAGNOSIS — M0579 Rheumatoid arthritis with rheumatoid factor of multiple sites without organ or systems involvement: Secondary | ICD-10-CM

## 2020-11-08 NOTE — Telephone Encounter (Signed)
Last Visit: 08/03/2020 Next Visit: 01/04/2021 Labs: 08/03/2020 Glucose is 56. Alk phos is low but stable. Rest of CMP WNL. CBC WNL. Eye exam: 11/11/2019 WNL  Current Dose per office note 08/03/2020: Plaquenil 200 mg 1 tablet by mouthdaily DX: Rheumatoid arthritis involving multiple sites with positive rheumatoid factor + CCP MTP Erosions   Okay to refill Plaquenil?

## 2020-11-08 NOTE — Telephone Encounter (Signed)
Please advise the patient to schedule updated PLQ eye exam.

## 2020-11-08 NOTE — Telephone Encounter (Signed)
Patient advised to schedule updated PLQ eye exam.  Patient will call to get that scheduled.

## 2020-12-07 ENCOUNTER — Encounter: Payer: Self-pay | Admitting: Rheumatology

## 2020-12-15 ENCOUNTER — Ambulatory Visit: Payer: Medicare HMO

## 2020-12-21 NOTE — Progress Notes (Signed)
Office Visit Note  Patient: Melinda Sparks             Date of Birth: 10/17/1956           MRN: [id number]             PCP: Bernerd Limbo, MD Referring: Bernerd Limbo, MD Visit Date: 01/04/2021 Occupation: @GUAROCC @  Subjective:  Joint stiffness in both hands   History of Present Illness: Melinda Sparks is a 65 y.o. female with history of rheumatoid arthritis. She states she continues to have a stiffness in her hands especially in the morning. She sometimes difficulty writing. None of the other joints are painful. She feels that her lower extremity muscles are not as strong as they used to be. She had her eye examination early this year.  Activities of Daily Living:  Patient reports morning stiffness for 1 hour.   Patient Denies nocturnal pain.  Difficulty dressing/grooming: Reports Difficulty climbing stairs: Denies Difficulty getting out of chair: Denies Difficulty using hands for taps, buttons, cutlery, and/or writing: Reports  Review of Systems  Constitutional: Positive for fatigue.  HENT: Negative for mouth sores, mouth dryness and nose dryness.   Eyes: Positive for itching and dryness.  Respiratory: Negative for shortness of breath and difficulty breathing.   Cardiovascular: Negative for chest pain and palpitations.  Gastrointestinal: Negative for blood in stool, constipation and diarrhea.  Endocrine: Negative for increased urination.  Genitourinary: Negative for difficulty urinating.  Musculoskeletal: Positive for arthralgias, joint pain, joint swelling and morning stiffness. Negative for myalgias, muscle tenderness and myalgias.  Skin: Negative for color change, rash and redness.  Allergic/Immunologic: Negative for susceptible to infections.  Neurological: Positive for dizziness, tremors, headaches and weakness. Negative for numbness.  Hematological: Positive for bruising/bleeding tendency.  Psychiatric/Behavioral: Negative for sleep disturbance.    PMFS  History:  Patient Active Problem List   Diagnosis Date Noted  . Primary osteoarthritis of both feet 07/24/2017  . Primary osteoarthritis of both knees 07/24/2017  . Fracture of metacarpal neck of right hand, closed 05/01/2017  . High risk medication use 03/15/2017  . Primary osteoarthritis of both hands 03/15/2017  . Pain in right hand 03/15/2017  . Age-related osteoporosis without current pathological fracture 03/15/2017  . 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 Allergy   . Family history of colon cancer in father   . Hyperlipidemia   . Hypothyroidism   . Rheumatoid arthritis, adult ([place])   . Seizures ([place])    epilepsy since teenager. - last seizure 3 months ago -02-2019 per husnabd at [facility] 05-20-2019  . Thyroid disease     Family History  Problem Relation Age of Onset  . Breast cancer Mother   . Colon cancer Father 42  . Colon polyps Neg Hx   . Esophageal cancer Neg Hx   . Rectal cancer Neg Hx   . Stomach cancer Neg Hx    Past Surgical History:  Procedure Laterality Date  . APPENDECTOMY    . CLOSED REDUCTION METACARPAL WITH PERCUTANEOUS PINNING Right 05/01/2017   Procedure: CLOSED REDUCTION METACARPAL WITH PERCUTANEOUS PINNING;  Surgeon: Garald Balding, MD;  Location: [place];  Service: Orthopedics;  Laterality: Right;  . COLONOSCOPY    . POLYPECTOMY     Social History   Social History Narrative  . Not on file   Immunization History  Administered Date(s) Administered  .  Influenza-Unspecified 09/20/2017  . Janssen (J&J) SARS-COV-2 Vaccination 02/15/2020  . Moderna Sars-Covid-2 Vaccination 10/12/2020  . Pneumococcal Polysaccharide-23 11/20/2011  . Tdap 10/09/2017     Objective: Vital Signs: BP 137/78 (BP Location: Left Arm, Patient Position: Sitting, Cuff Size: Normal)   Pulse 80   Resp 14   Ht 5\' 3"  (1.6 m)   Wt 104 lb (47.2 kg)   BMI 18.42  kg/m    Physical Exam Vitals and nursing note reviewed.  Constitutional:      Appearance: She is well-developed and well-nourished.  HENT:     Head: Normocephalic and atraumatic.  Eyes:     Extraocular Movements: EOM normal.     Conjunctiva/sclera: Conjunctivae normal.  Cardiovascular:     Rate and Rhythm: Normal rate and regular rhythm.     Pulses: Intact distal pulses.     Heart sounds: Normal heart sounds.  Pulmonary:     Effort: Pulmonary effort is normal.     Breath sounds: Normal breath sounds.  Abdominal:     General: Bowel sounds are normal.     Palpations: Abdomen is soft.  Musculoskeletal:     Cervical back: Normal range of motion.  Lymphadenopathy:     Cervical: No cervical adenopathy.  Skin:    General: Skin is warm and dry.     Capillary Refill: Capillary refill takes less than 2 seconds.  Neurological:     Mental Status: She is alert and oriented to person, place, and time.  Psychiatric:        Mood and Affect: Mood and affect normal.        Behavior: Behavior normal.      Musculoskeletal Exam: C-spine, thoracic and lumbar spine with good range of motion. Shoulder joints, elbow joints, wrist joints with good range of motion. She has bilateral MCP thickening and some synovitis in her right second MCP joint. Joints, knee joints, ankles were in good range of motion. She had no tenderness over ankles or MTPs. CDAI Exam: CDAI Score: 1.6  Patient Global: 3 mm; Provider Global: 3 mm Swollen: 1 ; Tender: 0  Joint Exam 01/04/2021      Right  Left  MCP 2  Swollen         Investigation: No additional findings.  Imaging: No results found.  Recent Labs: Lab Results  Component Value Date   WBC 5.0 08/03/2020   HGB 13.1 08/03/2020   PLT 287 08/03/2020   NA 140 08/03/2020   K 4.4 08/03/2020   CL 102 08/03/2020   CO2 29 08/03/2020   GLUCOSE 56 (L) 08/03/2020   BUN 10 08/03/2020   CREATININE 0.94 08/03/2020   BILITOT 0.3 08/03/2020   ALKPHOS 34  07/26/2017   AST 22 08/03/2020   ALT 16 08/03/2020   PROT 7.1 08/03/2020   ALBUMIN 4.5 07/26/2017   CALCIUM 10.0 08/03/2020   GFRAA 75 08/03/2020    Speciality Comments: PLQ Eye Exam: 12/07/2020 WNL @ [facility] follow up in 1 year Prior therapy includes: MTX (elevated creatinine)  Procedures:  No procedures performed Allergies: Felbamate, Tiagabine, and Zonisamide   Assessment / Plan:     Visit Diagnoses: 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 She has declined more aggressive therapy in the past. She continues to be on Plaquenil 1 tablet p.o. daily which she has been tolerating well.  High risk medication use - She has declined treatment with biologics in the past. She takes Plaquenil  200 mg 1 tablet by mouth daily. PLQ Eye Exam: 12/07/2020 - Plan: CBC with Differential/Platelet, COMPLETE METABOLIC PANEL WITH GFR today and then at the follow-up visit.  Primary osteoarthritis of both hands-joint protection muscle strengthening was discussed.  Primary osteoarthritis of both knees-she is currently asymptomatic.  Primary osteoarthritis of both feet-she had no tenderness or synovitis in her feet.  Age-related osteoporosis without current pathological fracture - DEXA on 10/01/19: The BMD measured at Femur Neck Right is 0.616 g/cm2 with a T-score of -3.0. She is taking Fosamax 10 mg 1 tablet by mouth daily. Patient believes she has been taking Fosamax for a long time. She may benefit from a drug holiday in trying some other medication. Have advised her to discuss this further with her PCP.  History of hyperlipidemia  History of anxiety  History of seizure disorder-followed by neurologist.  History of hypothyroidism  Educated about COVID-19 virus infection-she has had 2 doses of COVID-19 vaccine. She was advised  to get a booster 6 months later. Use of mask, social distancing and hand hygiene was discussed.  Orders: Orders Placed This Encounter  Procedures  . CBC with Differential/Platelet  . COMPLETE METABOLIC PANEL WITH GFR   No orders of the defined types were placed in this encounter.    Follow-Up Instructions: Return in about 5 months (around 06/03/2021) for Rheumatoid arthritis, Osteoarthritis.   Bo Merino, MD  Note - This record has been created using Editor, commissioning.  Chart creation errors have been sought, but may not always  have been located. Such creation errors do not reflect on  the standard of medical care.

## 2021-01-04 ENCOUNTER — Other Ambulatory Visit: Payer: Self-pay

## 2021-01-04 ENCOUNTER — Ambulatory Visit: Payer: Medicare HMO | Admitting: Rheumatology

## 2021-01-04 ENCOUNTER — Encounter (INDEPENDENT_AMBULATORY_CARE_PROVIDER_SITE_OTHER): Payer: Self-pay

## 2021-01-04 ENCOUNTER — Encounter: Payer: Self-pay | Admitting: Rheumatology

## 2021-01-04 VITALS — BP 137/78 | HR 80 | Resp 14 | Ht 63.0 in | Wt 104.0 lb

## 2021-01-04 DIAGNOSIS — M19041 Primary osteoarthritis, right hand: Secondary | ICD-10-CM | POA: Diagnosis not present

## 2021-01-04 DIAGNOSIS — Z79899 Other long term (current) drug therapy: Secondary | ICD-10-CM

## 2021-01-04 DIAGNOSIS — M170 Bilateral primary osteoarthritis of knee: Secondary | ICD-10-CM | POA: Diagnosis not present

## 2021-01-04 DIAGNOSIS — M0579 Rheumatoid arthritis with rheumatoid factor of multiple sites without organ or systems involvement: Secondary | ICD-10-CM | POA: Diagnosis not present

## 2021-01-04 DIAGNOSIS — M7712 Lateral epicondylitis, left elbow: Secondary | ICD-10-CM

## 2021-01-04 DIAGNOSIS — M19042 Primary osteoarthritis, left hand: Secondary | ICD-10-CM

## 2021-01-04 DIAGNOSIS — M810 Age-related osteoporosis without current pathological fracture: Secondary | ICD-10-CM

## 2021-01-04 DIAGNOSIS — M19072 Primary osteoarthritis, left ankle and foot: Secondary | ICD-10-CM

## 2021-01-04 DIAGNOSIS — Z8659 Personal history of other mental and behavioral disorders: Secondary | ICD-10-CM

## 2021-01-04 DIAGNOSIS — Z8669 Personal history of other diseases of the nervous system and sense organs: Secondary | ICD-10-CM

## 2021-01-04 DIAGNOSIS — M19071 Primary osteoarthritis, right ankle and foot: Secondary | ICD-10-CM

## 2021-01-04 DIAGNOSIS — Z8639 Personal history of other endocrine, nutritional and metabolic disease: Secondary | ICD-10-CM

## 2021-01-05 LAB — CBC WITH DIFFERENTIAL/PLATELET
Absolute Monocytes: 921 cells/uL (ref 200–950)
Basophils Absolute: 49 cells/uL (ref 0–200)
Basophils Relative: 0.5 %
Eosinophils Absolute: 108 cells/uL (ref 15–500)
Eosinophils Relative: 1.1 %
HCT: 37.8 % (ref 35.0–45.0)
Hemoglobin: 12.8 g/dL (ref 11.7–15.5)
Lymphs Abs: 657 cells/uL — ABNORMAL LOW (ref 850–3900)
MCH: 30.5 pg (ref 27.0–33.0)
MCHC: 33.9 g/dL (ref 32.0–36.0)
MCV: 90.2 fL (ref 80.0–100.0)
MPV: 9.1 fL (ref 7.5–12.5)
Monocytes Relative: 9.4 %
Neutro Abs: 8065 cells/uL — ABNORMAL HIGH (ref 1500–7800)
Neutrophils Relative %: 82.3 %
Platelets: 275 10*3/uL (ref 140–400)
RBC: 4.19 10*6/uL (ref 3.80–5.10)
RDW: 12 % (ref 11.0–15.0)
Total Lymphocyte: 6.7 %
WBC: 9.8 10*3/uL (ref 3.8–10.8)

## 2021-01-05 LAB — COMPLETE METABOLIC PANEL WITH GFR
AG Ratio: 1.8 (calc) (ref 1.0–2.5)
ALT: 20 U/L (ref 6–29)
AST: 26 U/L (ref 10–35)
Albumin: 4.4 g/dL (ref 3.6–5.1)
Alkaline phosphatase (APISO): 36 U/L — ABNORMAL LOW (ref 37–153)
BUN: 21 mg/dL (ref 7–25)
CO2: 30 mmol/L (ref 20–32)
Calcium: 10 mg/dL (ref 8.6–10.4)
Chloride: 99 mmol/L (ref 98–110)
Creat: 0.89 mg/dL (ref 0.50–0.99)
GFR, Est African American: 80 mL/min/{1.73_m2} (ref >=60)
GFR, Est Non African American: 69 mL/min/{1.73_m2} (ref >=60)
Globulin: 2.4 g/dL (calc) (ref 1.9–3.7)
Glucose, Bld: 73 mg/dL (ref 65–99)
Potassium: 4.3 mmol/L (ref 3.5–5.3)
Sodium: 138 mmol/L (ref 135–146)
Total Bilirubin: 0.3 mg/dL (ref 0.2–1.2)
Total Protein: 6.8 g/dL (ref 6.1–8.1)

## 2021-01-05 NOTE — Progress Notes (Signed)
CBC and CMP are stable.

## 2021-01-26 ENCOUNTER — Ambulatory Visit: Payer: Medicare HMO

## 2021-03-08 ENCOUNTER — Other Ambulatory Visit: Payer: Self-pay | Admitting: Physician Assistant

## 2021-03-08 DIAGNOSIS — M0579 Rheumatoid arthritis with rheumatoid factor of multiple sites without organ or systems involvement: Secondary | ICD-10-CM

## 2021-03-08 NOTE — Telephone Encounter (Signed)
Last Visit: 06/07/2021  Next Visit: 01/04/2021 Labs: 01/04/2021, CBC and CMP are stable. Eye exam: 12/07/2020  Current Dose per office note 01/04/2021, Plaquenil  200 mg 1 tablet by mouth daily.  UR:[id number] arthritis involving multiple sites with positive rheumatoid factor + CCP MTP Erosions   Last Fill: 11/08/2020  Okay to refill Plaquenil?

## 2021-05-10 ENCOUNTER — Other Ambulatory Visit: Payer: Self-pay | Admitting: Physician Assistant

## 2021-05-10 DIAGNOSIS — M0579 Rheumatoid arthritis with rheumatoid factor of multiple sites without organ or systems involvement: Secondary | ICD-10-CM

## 2021-05-20 ENCOUNTER — Telehealth: Payer: Self-pay

## 2021-05-20 NOTE — Telephone Encounter (Signed)
Attempted to contact the patient and left message for patient to call the office.  

## 2021-05-20 NOTE — Telephone Encounter (Signed)
Patient left a voicemail stating she has questions regarding her prescription of Hydroxychloroquine and requested a return call.

## 2021-05-23 NOTE — Telephone Encounter (Signed)
Attempted to contact the patient and left message for patient to call the office.  

## 2021-05-24 ENCOUNTER — Other Ambulatory Visit: Payer: Self-pay

## 2021-05-24 DIAGNOSIS — M0579 Rheumatoid arthritis with rheumatoid factor of multiple sites without organ or systems involvement: Secondary | ICD-10-CM

## 2021-05-24 MED ORDER — HYDROXYCHLOROQUINE SULFATE 200 MG PO TABS: 200.0000 mg | ORAL_TABLET | Freq: Every day | ORAL | 0 refills | Status: DC

## 2021-05-24 NOTE — Telephone Encounter (Signed)
Patient states she is taking PLQ. Patient was verifying her dose. Patient states she is needing a refill on the PLQ.  Last Visit: 06/07/2021  Next Visit: 01/04/2021  Labs: 01/04/2021, CBC and CMP are stable.  Eye exam:  12/07/2020   Current Dose per office note 01/04/2021: Plaquenil  200 mg 1 tablet by mouth daily.   YP:[id number] arthritis involving multiple sites with positive rheumatoid factor + CCP MTP Erosions   Last Fill: 03/08/2021  Okay to refill Plaquenil?

## 2021-05-24 NOTE — Telephone Encounter (Signed)
Patient left a voicemail stating she was returning Andrea's call regarding her prescription of Hydroxychloroquine.  Patient states she is going out of town due to a family emergency.

## 2021-05-24 NOTE — Progress Notes (Signed)
Office Visit Note  Patient: Melinda Sparks             Date of Birth: [birth year]           MRN: [id number]             PCP: Bernerd Limbo, MD Referring: Bernerd Limbo, MD Visit Date: 06/07/2021 Occupation: @GUAROCC @  Subjective:  Medication monitoring   History of Present Illness: Melinda Sparks is a 65 y.o. female with history of seropositive rheumatoid arthritis, osteoarthritis, and osteoporosis.  Patient is taking Plaquenil 200 mg 1 tablet by mouth daily.  She continues to tolerate PLQ without any side effects.  She denies any recent rheumatoid arthritis flares.  She has noticed decreased grip strength in both hands.  She has difficulty opening jars at times due to the pain and stiffness in her thumbs.  She denies any joint swelling at this time. She discontinued taking fosamax per recommendations made by her PCP. She continues to take calcium and vitamin D daily.     Activities of Daily Living:  Patient reports morning stiffness for 0 minutes.   Patient Denies nocturnal pain.  Difficulty dressing/grooming: Denies Difficulty climbing stairs: Denies Difficulty getting out of chair: Denies Difficulty using hands for taps, buttons, cutlery, and/or writing: Denies  Review of Systems  Constitutional:  Negative for fatigue.  HENT:  Negative for mouth sores, mouth dryness and nose dryness.   Eyes:  Positive for itching. Negative for pain, visual disturbance and dryness.  Respiratory:  Negative for cough, hemoptysis, shortness of breath and difficulty breathing.   Cardiovascular:  Negative for chest pain, palpitations and swelling in legs/feet.  Gastrointestinal:  Negative for abdominal pain, blood in stool, constipation and diarrhea.  Endocrine: Negative for increased urination.  Genitourinary:  Negative for painful urination.  Musculoskeletal:  Positive for joint pain, joint pain, myalgias and myalgias. Negative for joint swelling, muscle weakness, morning stiffness and muscle  tenderness.  Skin:  Negative for color change, rash and redness.  Allergic/Immunologic: Negative for susceptible to infections.  Neurological:  Positive for dizziness, numbness and parasthesias. Negative for headaches, memory loss and weakness.  Hematological:  Negative for swollen glands.  Psychiatric/Behavioral:  Negative for confusion and sleep disturbance.    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 - last seizure 3 months ago -02-2019 per husnabd at [facility] 05-20-2019   Thyroid disease     Family History  Problem Relation Age of Onset   Breast cancer Mother    Hypertension Mother    Colon cancer Father 66   Colon polyps Neg Hx    Esophageal cancer Neg Hx    Rectal cancer Neg Hx    Stomach cancer Neg Hx    Past Surgical History:  Procedure  Laterality Date   APPENDECTOMY     CLOSED REDUCTION METACARPAL WITH PERCUTANEOUS PINNING Right 05/01/2017   Procedure: CLOSED REDUCTION METACARPAL WITH PERCUTANEOUS PINNING;  Surgeon: Garald Balding, MD;  Location: [place];  Service: Orthopedics;  Laterality: Right;   COLONOSCOPY     POLYPECTOMY     Social History   Social History Narrative   Not on file   Immunization History  Administered Date(s) Administered    Influenza-Unspecified 09/20/2017   Janssen (J&J) SARS-COV-2 Vaccination 02/15/2020   Moderna Sars-Covid-2 Vaccination 10/12/2020   Pneumococcal Polysaccharide-23 11/20/2011   Tdap 10/09/2017     Objective: Vital Signs: BP 120/75 (BP Location: Left Arm, Patient Position: Sitting, Cuff Size: Normal)   Pulse 74   Ht 5\' 3"  (1.6 m)   Wt 100 lb (45.4 kg)   BMI 17.71 kg/m    Physical Exam Vitals and nursing note reviewed.  Constitutional:      Appearance: She is well-developed.  HENT:     Head: Normocephalic and atraumatic.  Eyes:     Conjunctiva/sclera: Conjunctivae normal.  Pulmonary:     Effort: Pulmonary effort is normal.  Abdominal:     Palpations: Abdomen is soft.  Musculoskeletal:     Cervical back: Normal range of motion.  Skin:    General: Skin is warm and dry.     Capillary Refill: Capillary refill takes less than 2 seconds.  Neurological:     Mental Status: She is alert and oriented to person, place, and time.  Psychiatric:        Behavior: Behavior normal.     Musculoskeletal Exam: C-spine, thoracic spine, and lumbar spine have good range of motion with no discomfort.  Shoulder joints, elbow joints, wrist joints, MCPs, PIPs, DIPs have good range of motion with no synovitis.  Some discomfort with flexion extension of both wrists.  Tenderness over the right first MCP joint noted.  PIP and DIP thickening consistent with osteoarthritis of both hands noted.  Hip joints have good range of motion with no discomfort.  Knee joints have good range of motion with no warmth or effusion.  Ankle joints have good range of motion with no tenderness or joint swelling.  Bunions noted over the bilateral first and MTPs.  PIP and DIP thickening consistent with osteoarthritis of both feet noted.  CDAI Exam: CDAI Score: -- Patient Global: --; Provider Global: -- Swollen: --; Tender: -- Joint Exam 06/07/2021   No joint exam has been documented for this visit   There is currently no  information documented on the homunculus. Go to the Rheumatology activity and complete the homunculus joint exam.  Investigation: No additional findings.  Imaging: No results found.  Recent Labs: Lab Results  Component Value Date   WBC 9.8 01/04/2021   HGB 12.8 01/04/2021   PLT 275 01/04/2021   NA 138 01/04/2021   K 4.3 01/04/2021   CL 99 01/04/2021   CO2 30 01/04/2021   GLUCOSE 73 01/04/2021   BUN 21 01/04/2021   CREATININE 0.89 01/04/2021   BILITOT 0.3 01/04/2021   ALKPHOS 34 07/26/2017   AST 26 01/04/2021   ALT 20 01/04/2021   PROT 6.8 01/04/2021   ALBUMIN 4.5 07/26/2017   CALCIUM 10.0 01/04/2021   GFRAA 80 01/04/2021    Speciality Comments: PLQ Eye Exam: 12/07/2020 WNL @ [facility] follow up in 1 year Prior therapy includes: MTX (elevated creatinine)  Procedures:  No procedures performed Allergies: Felbamate, Tiagabine, and Zonisamide   Assessment / Plan:  Visit Diagnoses: 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  She has PIP and DIP thickening consistent with osteoarthritis of both hands.  Some tenderness over the right first MCP joint noted.  She was able to make a complete fist bilaterally.  Overall she has clinically been doing well taking Plaquenil 200 mg 1 tablet by mouth daily.  She continues to tolerate without any side effects.  She will remain on the current treatment regimen.  She was advised to notify us if she develops increased joint pain or joint swelling.  She will follow-up in the office in 5 months.  High risk medication use - She has declined treatment with biologics in the past. She takes Plaquenil  200 mg 1 tablet by mouth daily. PLQ Eye Exam: 12/07/2020.  CBC and CMP were drawn on 01/04/2021.  Orders for CBC and CMP were released today.  Her next lab work will be drawn in 5 months at her follow up visit.   - Plan: CBC  with Differential/Platelet, COMPLETE METABOLIC PANEL WITH GFR  Primary osteoarthritis of both hands: She has PIP and DIP thickening consistent with osteoarthritis of both hands.  She has tenderness palpation over the right first MCP joint.  No synovitis was noted.  She was able to make a complete fist bilaterally.  We discussed the importance of joint protection and muscle strengthening.  She was given a handout of hand exercises to perform.  She was also given a jar gripper to assist opening jars at home.  Primary osteoarthritis of both knees: She has good range of motion of both knee joints.  No warmth or effusion was noted.  Primary osteoarthritis of both feet: She has PIP and DIP thickening consistent with osteoarthritis of both feet.  Bunions over the first and fifth MTPs noted.  We discussed the importance of wearing proper fitting shoes with a large and soft toebox.  She was also encouraged to wear shoes with adequate arch support.  Age-related osteoporosis without current pathological fracture - DEXA on 10/01/19: The BMD measured at Femur Neck Right is 0.616 g/cm2 with a T-score of -3.0.  She was previously taking Fosamax 10 mg 1 tablet daily for several years but discontinued after her last office visit in March 2022.  She has been taking a calcium and vitamin D supplement as recommended.  She will be due to update DEXA in October 2022 which will be ordered by her PCP.  Other medical conditions are listed as follows:  History of anxiety  History of hyperlipidemia  History of hypothyroidism  History of seizure disorder - Followed by neurologist.  Orders: Orders Placed This Encounter  Procedures   CBC with Differential/Platelet   COMPLETE METABOLIC PANEL WITH GFR   No orders of the defined types were placed in this encounter.    Follow-Up Instructions: Return in about 5 months (around 11/07/2021) for Rheumatoid arthritis, Osteoarthritis, Osteoporosis.   Ofilia Neas,  PA-C  Note - This record has been created using Dragon software.  Chart creation errors have been sought, but may not always  have been located. Such creation errors do not reflect on  the standard of medical care.

## 2021-06-07 ENCOUNTER — Encounter: Payer: Self-pay | Admitting: Physician Assistant

## 2021-06-07 ENCOUNTER — Other Ambulatory Visit: Payer: Self-pay

## 2021-06-07 ENCOUNTER — Ambulatory Visit: Payer: Medicare HMO | Admitting: Physician Assistant

## 2021-06-07 VITALS — BP 120/75 | HR 74 | Ht 63.0 in | Wt 100.0 lb

## 2021-06-07 DIAGNOSIS — M170 Bilateral primary osteoarthritis of knee: Secondary | ICD-10-CM | POA: Diagnosis not present

## 2021-06-07 DIAGNOSIS — M19042 Primary osteoarthritis, left hand: Secondary | ICD-10-CM

## 2021-06-07 DIAGNOSIS — Z79899 Other long term (current) drug therapy: Secondary | ICD-10-CM | POA: Diagnosis not present

## 2021-06-07 DIAGNOSIS — M19072 Primary osteoarthritis, left ankle and foot: Secondary | ICD-10-CM

## 2021-06-07 DIAGNOSIS — Z8639 Personal history of other endocrine, nutritional and metabolic disease: Secondary | ICD-10-CM

## 2021-06-07 DIAGNOSIS — Z8669 Personal history of other diseases of the nervous system and sense organs: Secondary | ICD-10-CM

## 2021-06-07 DIAGNOSIS — Z8659 Personal history of other mental and behavioral disorders: Secondary | ICD-10-CM

## 2021-06-07 DIAGNOSIS — M19071 Primary osteoarthritis, right ankle and foot: Secondary | ICD-10-CM

## 2021-06-07 DIAGNOSIS — M0579 Rheumatoid arthritis with rheumatoid factor of multiple sites without organ or systems involvement: Secondary | ICD-10-CM

## 2021-06-07 DIAGNOSIS — M19041 Primary osteoarthritis, right hand: Secondary | ICD-10-CM | POA: Diagnosis not present

## 2021-06-07 DIAGNOSIS — M810 Age-related osteoporosis without current pathological fracture: Secondary | ICD-10-CM

## 2021-06-07 NOTE — Patient Instructions (Signed)
Hand Exercises Hand exercises can be helpful for almost anyone. These exercises can strengthen the hands, improve flexibility and movement, and increase blood flow to the hands. These results can make work and daily tasks easier. Hand exercises can be especially helpful for people who have joint pain from arthritis or have nerve damage from overuse (carpal tunnel syndrome). These exercises can also help people who have injured a hand. Exercises Most of these hand exercises are gentle stretching and motion exercises. It is usually safe to do them often throughout the day. Warming up your hands before exercise may help to reduce stiffness. You can do this with gentle massage orby placing your hands in warm water for 10-15 minutes. It is normal to feel some stretching, pulling, tightness, or mild discomfort as you begin new exercises. This will gradually improve. Stop an exercise right away if you feel sudden, severe pain or your pain gets worse. Ask your healthcare provider which exercises are best for you. Knuckle bend or "claw" fist Stand or sit with your arm, hand, and all five fingers pointed straight up. Make sure to keep your wrist straight during the exercise. Gently bend your fingers down toward your palm until the tips of your fingers are touching the top of your palm. Keep your big knuckle straight and just bend the small knuckles in your fingers. Hold this position for __________ seconds. Straighten (extend) your fingers back to the starting position. Repeat this exercise 5-10 times with each hand. Full finger fist Stand or sit with your arm, hand, and all five fingers pointed straight up. Make sure to keep your wrist straight during the exercise. Gently bend your fingers into your palm until the tips of your fingers are touching the middle of your palm. Hold this position for __________ seconds. Extend your fingers back to the starting position, stretching every joint fully. Repeat this  exercise 5-10 times with each hand. Straight fist Stand or sit with your arm, hand, and all five fingers pointed straight up. Make sure to keep your wrist straight during the exercise. Gently bend your fingers at the big knuckle, where your fingers meet your hand, and the middle knuckle. Keep the knuckle at the tips of your fingers straight and try to touch the bottom of your palm. Hold this position for __________ seconds. Extend your fingers back to the starting position, stretching every joint fully. Repeat this exercise 5-10 times with each hand. Tabletop Stand or sit with your arm, hand, and all five fingers pointed straight up. Make sure to keep your wrist straight during the exercise. Gently bend your fingers at the big knuckle, where your fingers meet your hand, as far down as you can while keeping the small knuckles in your fingers straight. Think of forming a tabletop with your fingers. Hold this position for __________ seconds. Extend your fingers back to the starting position, stretching every joint fully. Repeat this exercise 5-10 times with each hand. Finger spread Place your hand flat on a table with your palm facing down. Make sure your wrist stays straight as you do this exercise. Spread your fingers and thumb apart from each other as far as you can until you feel a gentle stretch. Hold this position for __________ seconds. Bring your fingers and thumb tight together again. Hold this position for __________ seconds. Repeat this exercise 5-10 times with each hand. Making circles Stand or sit with your arm, hand, and all five fingers pointed straight up. Make sure to keep your   wrist straight during the exercise. Make a circle by touching the tip of your thumb to the tip of your index finger. Hold for __________ seconds. Then open your hand wide. Repeat this motion with your thumb and each finger on your hand. Repeat this exercise 5-10 times with each hand. Thumb motion Sit  with your forearm resting on a table and your wrist straight. Your thumb should be facing up toward the ceiling. Keep your fingers relaxed as you move your thumb. Lift your thumb up as high as you can toward the ceiling. Hold for __________ seconds. Bend your thumb across your palm as far as you can, reaching the tip of your thumb for the small finger (pinkie) side of your palm. Hold for __________ seconds. Repeat this exercise 5-10 times with each hand. Grip strengthening  Hold a stress ball or other soft ball in the middle of your hand. Slowly increase the pressure, squeezing the ball as much as you can without causing pain. Think of bringing the tips of your fingers into the middle of your palm. All of your finger joints should bend when doing this exercise. Hold your squeeze for __________ seconds, then relax. Repeat this exercise 5-10 times with each hand. Contact a health care provider if: Your hand pain or discomfort gets much worse when you do an exercise. Your hand pain or discomfort does not improve within 2 hours after you exercise. If you have any of these problems, stop doing these exercises right away. Do not do them again unless your health care provider says that you can. Get help right away if: You develop sudden, severe hand pain or swelling. If this happens, stop doing these exercises right away. Do not do them again unless your health care provider says that you can. This information is not intended to replace advice given to you by your health care provider. Make sure you discuss any questions you have with your healthcare provider. Document Revised: 03/13/2019 Document Reviewed: 11/21/2018 Elsevier Patient Education  2022 [employer]vier Inc.  

## 2021-06-08 LAB — TEST AUTHORIZATION

## 2021-06-08 LAB — COMPLETE METABOLIC PANEL WITH GFR
AG Ratio: 1.4 (calc) (ref 1.0–2.5)
ALT: 14 U/L (ref 6–29)
AST: 22 U/L (ref 10–35)
Albumin: 4.2 g/dL (ref 3.6–5.1)
Alkaline phosphatase (APISO): 28 U/L — ABNORMAL LOW (ref 37–153)
BUN: 13 mg/dL (ref 7–25)
CO2: 30 mmol/L (ref 20–32)
Calcium: 10.1 mg/dL (ref 8.6–10.4)
Chloride: 102 mmol/L (ref 98–110)
Creat: 0.95 mg/dL (ref 0.50–0.99)
GFR, Est African American: 74 mL/min/{1.73_m2} (ref >=60)
GFR, Est Non African American: 64 mL/min/{1.73_m2} (ref >=60)
Globulin: 2.9 g/dL (calc) (ref 1.9–3.7)
Glucose, Bld: 85 mg/dL (ref 65–99)
Potassium: 4.7 mmol/L (ref 3.5–5.3)
Sodium: 139 mmol/L (ref 135–146)
Total Bilirubin: 0.4 mg/dL (ref 0.2–1.2)
Total Protein: 7.1 g/dL (ref 6.1–8.1)

## 2021-06-08 LAB — CBC WITH DIFFERENTIAL/PLATELET
Absolute Monocytes: 546 cells/uL (ref 200–950)
Basophils Absolute: 52 cells/uL (ref 0–200)
Basophils Relative: 1 %
Eosinophils Absolute: 57 cells/uL (ref 15–500)
Eosinophils Relative: 1.1 %
HCT: 38.5 % (ref 35.0–45.0)
Hemoglobin: 12.7 g/dL (ref 11.7–15.5)
Lymphs Abs: 1274 cells/uL (ref 850–3900)
MCH: 30.5 pg (ref 27.0–33.0)
MCHC: 33 g/dL (ref 32.0–36.0)
MCV: 92.3 fL (ref 80.0–100.0)
MPV: 8.9 fL (ref 7.5–12.5)
Monocytes Relative: 10.5 %
Neutro Abs: 3271 cells/uL (ref 1500–7800)
Neutrophils Relative %: 62.9 %
Platelets: 276 10*3/uL (ref 140–400)
RBC: 4.17 10*6/uL (ref 3.80–5.10)
RDW: 12.6 % (ref 11.0–15.0)
Total Lymphocyte: 24.5 %
WBC: 5.2 10*3/uL (ref 3.8–10.8)

## 2021-06-08 LAB — PHOSPHORUS: Phosphorus: 4.4 mg/dL (ref 2.5–4.5)

## 2021-06-08 NOTE — Progress Notes (Signed)
Phosphorus WNL.

## 2021-06-08 NOTE — Progress Notes (Signed)
Alk phos is low-28. Rest of CMP WNL.  Please add phosphorus.  CBC WNL.   Continue on plaquenil 200 mg 1 tablet by mouth daily.

## 2021-08-29 ENCOUNTER — Other Ambulatory Visit: Payer: Self-pay

## 2021-08-29 DIAGNOSIS — M0579 Rheumatoid arthritis with rheumatoid factor of multiple sites without organ or systems involvement: Secondary | ICD-10-CM

## 2021-08-29 MED ORDER — HYDROXYCHLOROQUINE SULFATE 200 MG PO TABS
200.0000 mg | ORAL_TABLET | Freq: Every day | ORAL | 0 refills | Status: DC
Start: 2021-08-29 — End: 2022-11-22

## 2021-08-29 NOTE — Telephone Encounter (Signed)
Patient advised she is supposed to be taking Plaquenil  200 mg 1 tablet by mouth daily.

## 2021-08-29 NOTE — Telephone Encounter (Signed)
Patient requested a return call to check if she is suppose to be taking Hydroxychloroquine daily.

## 2021-08-29 NOTE — Telephone Encounter (Signed)
Next Visit: 11/08/2021  Last Visit: 06/07/2021  Labs: 06/07/2021 Alk phos is low-28. Rest of CMP WNL.  Eye exam: 12/07/2020   Current Dose per office note 06/07/2021: Plaquenil  200 mg 1 tablet by mouth daily  MA:YOKHTXHFSF arthritis involving multiple sites with positive rheumatoid factor + CCP MTP Erosions   Last Fill: 05/24/2021  Okay to refill Plaquenil?

## 2021-08-29 NOTE — Addendum Note (Signed)
Addended by: Carole Binning on: 08/29/2021 01:32 PM   Modules accepted: Orders

## 2021-10-11 ENCOUNTER — Other Ambulatory Visit: Payer: Self-pay | Admitting: Physician Assistant

## 2021-10-11 DIAGNOSIS — M0579 Rheumatoid arthritis with rheumatoid factor of multiple sites without organ or systems involvement: Secondary | ICD-10-CM

## 2021-10-31 NOTE — Progress Notes (Deleted)
Office Visit Note  Patient: Melinda Sparks             Date of Birth: 24-Dec-1955           MRN: [id number]             PCP: Bernerd Limbo, MD Referring: Bernerd Limbo, MD Visit Date: 11/08/2021 Occupation: @GUAROCC @  Subjective:  No chief complaint on file.   History of Present Illness: Melinda Sparks is a 65 y.o. female ***   Activities of Daily Living:  Patient reports morning stiffness for *** {minute/hour:19697}.   Patient {ACTIONS;DENIES/REPORTS:21021675::"Denies"} nocturnal pain.  Difficulty dressing/grooming: {ACTIONS;DENIES/REPORTS:21021675::"Denies"} Difficulty climbing stairs: {ACTIONS;DENIES/REPORTS:21021675::"Denies"} Difficulty getting out of chair: {ACTIONS;DENIES/REPORTS:21021675::"Denies"} Difficulty using hands for taps, buttons, cutlery, and/or writing: {ACTIONS;DENIES/REPORTS:21021675::"Denies"}  No Rheumatology ROS completed.   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 - last seizure 3 months ago -02-2019 per husnabd at [facility] 05-20-2019   Thyroid disease     Family History  Problem Relation Age of Onset   Breast cancer Mother    Hypertension Mother    Colon cancer Father 79   Colon polyps Neg Hx    Esophageal cancer Neg Hx    Rectal cancer  Neg Hx    Stomach cancer Neg Hx    Past Surgical History:  Procedure Laterality Date   APPENDECTOMY     CLOSED REDUCTION METACARPAL WITH PERCUTANEOUS PINNING Right 05/01/2017   Procedure: CLOSED REDUCTION METACARPAL WITH PERCUTANEOUS PINNING;  Surgeon: Garald Balding, MD;  Location: [facility];  Service: Orthopedics;  Laterality: Right;   COLONOSCOPY     POLYPECTOMY     Social History   Social History Narrative   Not on file   Immunization History  Administered Date(s) Administered   Influenza-Unspecified 09/20/2017   Moderna Sars-Covid-2 Vaccination 10/12/2020   Pneumococcal Polysaccharide-23 11/20/2011   Tdap 10/09/2017     Objective: Vital Signs: There were no vitals taken for this visit.   Physical Exam   Musculoskeletal Exam: ***  CDAI Exam: CDAI Score: -- Patient Global: --; Provider Global: -- Swollen: --; Tender: -- Joint Exam 11/08/2021   No joint exam has been documented for this visit   There is currently no information documented on the homunculus. Go to the Rheumatology activity and complete the homunculus joint exam.  Investigation: No additional findings.  Imaging: No results found.  Recent Labs: Lab Results  Component Value Date   WBC 5.2 06/07/2021   HGB 12.7 06/07/2021   PLT 276 06/07/2021   NA 139 06/07/2021   K 4.7 06/07/2021   CL 102 06/07/2021   CO2 30 06/07/2021   GLUCOSE 85 06/07/2021   BUN 13 06/07/2021   CREATININE 0.95 06/07/2021   BILITOT 0.4 06/07/2021   ALKPHOS 34 07/26/2017  AST 22 06/07/2021   ALT 14 06/07/2021   PROT 7.1 06/07/2021   ALBUMIN 4.5 07/26/2017   CALCIUM 10.1 06/07/2021   GFRAA 74 06/07/2021    Speciality Comments: PLQ Eye Exam: 12/07/2020 WNL @ [facility] follow up in 1 year Prior therapy includes: MTX (elevated creatinine)  Procedures:  No procedures performed Allergies: Felbamate, Tiagabine, and Zonisamide   Assessment / Plan:     Visit Diagnoses: No diagnosis found.  Orders: No  orders of the defined types were placed in this encounter.  No orders of the defined types were placed in this encounter.   Face-to-face time spent with patient was *** minutes. Greater than 50% of time was spent in counseling and coordination of care.  Follow-Up Instructions: No follow-ups on file.   Earnestine Mealing, CMA  Note - This record has been created using Editor, commissioning.  Chart creation errors have been sought, but may not always  have been located. Such creation errors do not reflect on  the standard of medical care.

## 2021-11-08 ENCOUNTER — Ambulatory Visit: Payer: Medicare HMO | Admitting: Rheumatology

## 2022-04-21 ENCOUNTER — Encounter: Payer: Self-pay | Admitting: Gastroenterology

## 2022-10-16 ENCOUNTER — Telehealth: Payer: Self-pay | Admitting: *Deleted

## 2022-10-16 NOTE — Telephone Encounter (Signed)
Patient contacted the office stating she does not have any medication to take for her Rheumatoid Arthritis. Patient advised she has not been seen in our office since July 2022. Patient advised we would not be able to send in any prescriptions until she follows up in our office. Patient expressed understanding. Patient asked what she could do for her aches and pains. Patient advised to seek evaluation with PCP or urgent care.

## 2022-11-09 NOTE — Progress Notes (Unsigned)
Office Visit Note  Patient: Melinda Sparks             Date of Birth: December 30, 1955           MRN: [id number]             PCP: Bernerd Limbo, MD Referring: Bernerd Limbo, MD Visit Date: 11/22/2022 Occupation: '@GUAROCC'$ @  Subjective:  Pain in both hands   History of Present Illness: Melinda Sparks is a 66 y.o. female with history of seropositive rheumatoid arthritis and osteoarthritis.  Patient was last seen in the office in July 2022.  Patient was previously taking Plaquenil 200 mg 1 tablet by mouth daily.  She was tolerating Plaquenil without any side effects.  She is unsure when she discontinued Plaquenil but would like to resume therapy as previously prescribed.  She has been experiencing increased pain in both hands especially in bilateral first MCP joints.  She has been noticing intermittent stiffness and swelling in both thumbs.  She has not been taking any over-the-counter products for pain relief. She continues to follow-up closely with her neurologist given her history of seizures.  She states she has been experiencing worsening balance and dizziness first thing in the morning which typically improves as the day goes on.      Activities of Daily Living:  Patient reports morning stiffness for a few minutes.   Patient Reports nocturnal pain.  Difficulty dressing/grooming: Denies Difficulty climbing stairs: Denies Difficulty getting out of chair: Denies Difficulty using hands for taps, buttons, cutlery, and/or writing: Denies  Review of Systems  Constitutional:  Negative for fatigue.  HENT:  Negative for mouth sores and mouth dryness.   Eyes:  Positive for dryness.  Respiratory:  Negative for shortness of breath.   Cardiovascular:  Negative for chest pain and palpitations.  Gastrointestinal:  Negative for blood in stool, constipation and diarrhea.  Endocrine: Negative for increased urination.  Genitourinary:  Negative for involuntary urination.  Musculoskeletal:  Positive  for joint pain, joint pain, joint swelling and morning stiffness. Negative for gait problem, myalgias, muscle weakness, muscle tenderness and myalgias.  Skin:  Positive for rash. Negative for color change, hair loss and sensitivity to sunlight.  Allergic/Immunologic: Negative for susceptible to infections.  Neurological:  Positive for dizziness. Negative for headaches.  Hematological:  Negative for swollen glands.  Psychiatric/Behavioral:  Positive for sleep disturbance. Negative for depressed mood. The patient is not nervous/anxious.     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 - last seizure 3 months ago -02-2019 per husnabd at [facility] 05-20-2019   Thyroid disease     Family History  Problem Relation Age of Onset   Breast cancer Mother    Hypertension Mother    Colon cancer Father 11  Colon polyps Neg Hx    Esophageal cancer Neg Hx    Rectal cancer Neg Hx    Stomach cancer Neg Hx    Past Surgical History:  Procedure Laterality Date   APPENDECTOMY     CLOSED REDUCTION METACARPAL WITH PERCUTANEOUS PINNING Right 05/01/2017   Procedure: CLOSED REDUCTION METACARPAL WITH PERCUTANEOUS PINNING;  Surgeon: Garald Balding, MD;  Location: [place];  Service: Orthopedics;  Laterality:  Right;   COLONOSCOPY     POLYPECTOMY     Social History   Social History Narrative   Not on file   Immunization History  Administered Date(s) Administered   Influenza-Unspecified 09/20/2017   Janssen (J&J) SARS-COV-2 Vaccination 02/15/2020   Moderna Sars-Covid-2 Vaccination 10/12/2020   Pneumococcal Polysaccharide-23 11/20/2011   Tdap 10/09/2017     Objective: Vital Signs: BP 130/79 (BP Location: Left Arm, Patient Position: Sitting, Cuff Size: Normal)   Pulse 82   Ht '5\' 4"'$  (1.626 m)   Wt 103 lb 9.6 oz (47 kg)   BMI 17.78 kg/m    Physical Exam Vitals and nursing note reviewed.  Constitutional:      Appearance: She is well-developed.  HENT:     Head: Normocephalic and atraumatic.  Eyes:     Conjunctiva/sclera: Conjunctivae normal.  Cardiovascular:     Rate and Rhythm: Normal rate and regular rhythm.     Heart sounds: Normal heart sounds.  Pulmonary:     Effort: Pulmonary effort is normal.     Breath sounds: Normal breath sounds.  Abdominal:     General: Bowel sounds are normal.     Palpations: Abdomen is soft.  Musculoskeletal:     Cervical back: Normal range of motion.  Skin:    General: Skin is warm and dry.     Capillary Refill: Capillary refill takes less than 2 seconds.  Neurological:     Mental Status: She is alert and oriented to person, place, and time.  Psychiatric:        Behavior: Behavior normal.      Musculoskeletal Exam: C-spine limited ROM with lateral rotation.  Shoulder joints, elbow joints, and wrist joints have good ROM.  PIP and DIP thickening consistent with osteoarthritis of both hands.  Tenderness over bilateral 1st MCP and 1st PIP joint.  Tenderness over right 2nd MCP joint.  Hip joints have good ROM with no groin pain.  Knee joints have good ROM with no warmth or effusion.  Ankle joints have good ROM with no tenderness or synovitis. Bunion over bilateral 1st MTP and tailor bunions noted bilaterally.  PIP and DIP thickening consistent with  osteoarthritis of both hands.  No synovitis over MTP joints.    CDAI Exam: CDAI Score: 6  Patient Global: 5 mm; Provider Global: 5 mm Swollen: 0 ; Tender: 5  Joint Exam 11/22/2022      Right  Left  MCP 1   Tender   Tender  MCP 2   Tender     IP   Tender   Tender     Investigation: No additional findings.  Imaging: No results found.  Recent Labs: Lab Results  Component Value Date   WBC 5.2 06/07/2021   HGB 12.7 06/07/2021   PLT 276 06/07/2021   NA 139 06/07/2021   K 4.7 06/07/2021   CL 102 06/07/2021   CO2 30 06/07/2021   GLUCOSE 85 06/07/2021   BUN 13 06/07/2021   CREATININE 0.95 06/07/2021   BILITOT 0.4 06/07/2021   ALKPHOS 34 07/26/2017  AST 22 06/07/2021   ALT 14 06/07/2021   PROT 7.1 06/07/2021   ALBUMIN 4.5 07/26/2017   CALCIUM 10.1 06/07/2021   GFRAA 74 06/07/2021    Speciality Comments: PLQ Eye Exam: 12/13/2021 WNL @ [facility] follow up in 1 year Prior therapy includes: MTX (elevated creatinine)  Procedures:  No procedures performed Allergies: Felbamate, Tiagabine, and Zonisamide    Assessment / Plan:     Visit Diagnoses: Rheumatoid arthritis involving multiple sites with positive rheumatoid factor + CCP MTP Eros[facility]): Patient presents today with increased pain and stiffness in both hands.  She has tenderness palpation of bilateral first MCP and PIP joints in the right second MCP joint.  Patient was last seen in the office on 06/07/2021 at which time she was taking Plaquenil 200 mg 1 tablet daily.  She is unsure when she discontinued Plaquenil but would like to restart on therapy.  She previously found Plaquenil to be effective for managing her pain due to rheumatoid arthritis.  Indications, contraindications, and potential side effects of Plaquenil were discussed today in detail.  All questions were addressed and consent was updated.  A new prescription for Plaquenil 200 mg 1 tablet by mouth daily was sent to the pharmacy.  CBC and CMP were  updated today. She was advised to notify us if her hand pain and stiffness do not improved after reinitiating Plaquenil.  She will follow-up in the office in 3 months or sooner if needed.  Patient was counseled on the purpose, proper use, and adverse effects of hydroxychloroquine including nausea/diarrhea, skin rash, headaches, and sun sensitivity.  Advised patient to wear sunscreen once starting hydroxychloroquine to reduce risk of rash associated with sun sensitivity.  Discussed importance of annual eye exams while on hydroxychloroquine to monitor to ocular toxicity and discussed importance of frequent laboratory monitoring.  Provided patient with eye exam form for baseline ophthalmologic exam.  Reviewed risk for QTC prolongation when used in combination with other QTc prolonging agents (including but not limited to antiarrhythmics, macrolide antibiotics, flouroquinolones, tricyclic antidepressants, citalopram, specific antipsychotics, ondansetron, migraine triptans, and methadone). Provided patient with educational materials on hydroxychloroquine and answered all questions.  Patient consented to hydroxychloroquine. Will upload consent in the media tab.    Dose will be Plaquenil 200 mg once daily.  Prescription pending lab results.   High risk medication use - Plaquenil  200 mg 1 tablet by mouth daily.  PLQ Eye Exam: 12/13/2021 WNL @ [facility] follow up in 1 year.  Advised patient to schedule an updated Plaquenil eye examination.  She was given a Plaquenil eye examination form to take with her to her next appointment. CBC WNL on 09/04/22.  Orders for CBC and CMP released today.  - Plan: CBC with Differential/Platelet, COMPLETE METABOLIC PANEL WITH GFR  Primary osteoarthritis of both hands: She has PIP and DIP thickening consistent with osteoarthritis of both hands.  Prominence of bilateral CMC joints.  Patient presents today with increased pain and stiffness in both hands due to underlying  osteoarthritis and rheumatoid arthritis overlap.  She will be restarted on Plaquenil as discussed above. Discussed the importance of joint protection and muscle strengthening.  Primary osteoarthritis of both knees: She has good range of motion of both knee joints on examination today.  No warmth or effusion noted.  Primary osteoarthritis of both feet: She has PIP and DIP thickening consistent with osteoarthritis of both feet.  Bunion over bilateral first MTP joints, left foot greater than right as  well as bilateral tailor bunions noted.  Small dorsal spurs noted.  No tenderness or synovitis over MTP joints.  She has been wearing toe spacers which has been helpful for the overcrowding of her toes.  Discussed the importance of wearing proper fitting shoes.  Age-related osteoporosis without current pathological fracture - DEXA on 10/01/19: The BMD measured at Femur Neck Right is 0.616 g/cm2 with a T-score of -3.0.  She has been taking a calcium supplement daily.  DEXA previously ordered by PCP through [facility].  Patient plans on having an updated bone density in 2024.  Other medical conditions are listed as follows:  History of hyperlipidemia  History of anxiety  History of hypothyroidism  History of seizure disorder - Followed by neurologist.  Orders: Orders Placed This Encounter  Procedures   CBC with Differential/Platelet   COMPLETE METABOLIC PANEL WITH GFR   Meds ordered this encounter  Medications   hydroxychloroquine (PLAQUENIL) 200 MG tablet    Sig: Take 1 tablet (200 mg total) by mouth daily.    Dispense:  90 tablet    Refill:  0    Follow-Up Instructions: Return in about 3 months (around 02/21/2023) for Rheumatoid arthritis.   Ofilia Neas, PA-C  Note - This record has been created using Dragon software.  Chart creation errors have been sought, but may not always  have been located. Such creation errors do not reflect on  the standard of medical care.

## 2022-11-22 ENCOUNTER — Encounter: Payer: Self-pay | Admitting: Physician Assistant

## 2022-11-22 ENCOUNTER — Ambulatory Visit: Payer: Medicare HMO | Attending: Physician Assistant | Admitting: Physician Assistant

## 2022-11-22 VITALS — BP 130/79 | HR 82 | Ht 64.0 in | Wt 103.6 lb

## 2022-11-22 DIAGNOSIS — M19041 Primary osteoarthritis, right hand: Secondary | ICD-10-CM

## 2022-11-22 DIAGNOSIS — M19042 Primary osteoarthritis, left hand: Secondary | ICD-10-CM

## 2022-11-22 DIAGNOSIS — M170 Bilateral primary osteoarthritis of knee: Secondary | ICD-10-CM | POA: Diagnosis not present

## 2022-11-22 DIAGNOSIS — Z79899 Other long term (current) drug therapy: Secondary | ICD-10-CM | POA: Diagnosis not present

## 2022-11-22 DIAGNOSIS — M19072 Primary osteoarthritis, left ankle and foot: Secondary | ICD-10-CM

## 2022-11-22 DIAGNOSIS — M0579 Rheumatoid arthritis with rheumatoid factor of multiple sites without organ or systems involvement: Secondary | ICD-10-CM

## 2022-11-22 DIAGNOSIS — M810 Age-related osteoporosis without current pathological fracture: Secondary | ICD-10-CM

## 2022-11-22 DIAGNOSIS — Z8659 Personal history of other mental and behavioral disorders: Secondary | ICD-10-CM

## 2022-11-22 DIAGNOSIS — Z8639 Personal history of other endocrine, nutritional and metabolic disease: Secondary | ICD-10-CM

## 2022-11-22 DIAGNOSIS — Z8669 Personal history of other diseases of the nervous system and sense organs: Secondary | ICD-10-CM

## 2022-11-22 DIAGNOSIS — M19071 Primary osteoarthritis, right ankle and foot: Secondary | ICD-10-CM

## 2022-11-22 MED ORDER — HYDROXYCHLOROQUINE SULFATE 200 MG PO TABS: 200.0000 mg | ORAL_TABLET | Freq: Every day | ORAL | 0 refills | Status: DC

## 2022-11-22 NOTE — Patient Instructions (Signed)
Hydroxychloroquine Tablets What is this medication? HYDROXYCHLOROQUINE (hye drox ee KLOR oh kwin) treats autoimmune conditions, such as rheumatoid arthritis and lupus. It works by slowing down an overactive immune system. It may also be used to prevent and treat malaria. It works by killing the parasite that causes malaria. It belongs to a group of medications called DMARDs. This medicine may be used for other purposes; ask your health care provider or pharmacist if you have questions. COMMON BRAND NAME(S): Plaquenil, Quineprox What should I tell my care team before I take this medication? They need to know if you have any of these conditions: Diabetes Eye disease, vision problems Frequently drink alcohol G6PD deficiency Heart disease Irregular heartbeat or rhythm Kidney disease Liver disease Porphyria Psoriasis An unusual or allergic reaction to hydroxychloroquine, other medications, foods, dyes, or preservatives Pregnant or trying to get pregnant Breastfeeding How should I use this medication? Take this medication by mouth with water. Take it as directed on the prescription label. Do not cut, crush, or chew this medication. Swallow the tablets whole. Take it with food. Do not take it more than directed. Take all of this medication unless your care team tells you to stop it early. Keep taking it even if you think you are better. Take products with antacids in them at a different time of day than this medication. Take this medication 4 hours before or 4 hours after antacids. Talk to your care team if you have questions. Talk to your care team about the use of this medication in children. While this medication may be prescribed for selected conditions, precautions do apply. Overdosage: If you think you have taken too much of this medicine contact a poison control center or emergency room at once. NOTE: This medicine is only for you. Do not share this medicine with others. What if I miss a  dose? If you miss a dose, take it as soon as you can. If it is almost time for your next dose, take only that dose. Do not take double or extra doses. What may interact with this medication? Do not take this medication with any of the following: Cisapride Dronedarone Pimozide Thioridazine This medication may also interact with the following: Ampicillin Antacids Cimetidine Cyclosporine Digoxin Kaolin Medications for diabetes, such as insulin, glipizide, glyburide Medications for seizures, such as carbamazepine, phenobarbital, phenytoin Mefloquine Methotrexate Other medications that cause heart rhythm changes Praziquantel This list may not describe all possible interactions. Give your health care provider a list of all the medicines, herbs, non-prescription drugs, or dietary supplements you use. Also tell them if you smoke, drink alcohol, or use illegal drugs. Some items may interact with your medicine. What should I watch for while using this medication? Visit your care team for regular checks on your progress. Tell your care team if your symptoms do not start to get better or if they get worse. You may need blood work done while you are taking this medication. If you take other medications that can affect heart rhythm, you may need more testing. Talk to your care team if you have questions. Your vision may be tested before and during use of this medication. Tell your care team right away if you have any change in your eyesight. This medication may cause serious skin reactions. They can happen weeks to months after starting the medication. Contact your care team right away if you notice fevers or flu-like symptoms with a rash. The rash may be red or purple and then turn   into blisters or peeling of the skin. Or, you might notice a red rash with swelling of the face, lips or lymph nodes in your neck or under your arms. If you or your family notice any changes in your behavior, such as new or  worsening depression, thoughts of harming yourself, anxiety, or other unusual or disturbing thoughts, or memory loss, call your care team right away. What side effects may I notice from receiving this medication? Side effects that you should report to your care team as soon as possible: Allergic reactions--skin rash, itching, hives, swelling of the face, lips, tongue, or throat Aplastic anemia--unusual weakness or fatigue, dizziness, headache, trouble breathing, increased bleeding or bruising Change in vision Heart rhythm changes--fast or irregular heartbeat, dizziness, feeling faint or lightheaded, chest pain, trouble breathing Infection--fever, chills, cough, or sore throat Low blood sugar (hypoglycemia)--tremors or shaking, anxiety, sweating, cold or clammy skin, confusion, dizziness, rapid heartbeat Muscle injury--unusual weakness or fatigue, muscle pain, dark yellow or brown urine, decrease in amount of urine Pain, tingling, or numbness in the hands or feet Rash, fever, and swollen lymph nodes Redness, blistering, peeling, or loosening of the skin, including inside the mouth Thoughts of suicide or self-harm, worsening mood, or feelings of depression Unusual bruising or bleeding Side effects that usually do not require medical attention (report to your care team if they continue or are bothersome): Diarrhea Headache Nausea Stomach pain Vomiting This list may not describe all possible side effects. Call your doctor for medical advice about side effects. You may report side effects to FDA at 1-800-FDA-1088. Where should I keep my medication? Keep out of the reach of children and pets. Store at room temperature up to 30 degrees C (86 degrees F). Protect from light. Get rid of any unused medication after the expiration date. To get rid of medications that are no longer needed or have expired: Take the medication to a medication take-back program. Check with your pharmacy or law enforcement  to find a location. If you cannot return the medication, check the label or package insert to see if the medication should be thrown out in the garbage or flushed down the toilet. If you are not sure, ask your care team. If it is safe to put it in the trash, empty the medication out of the container. Mix the medication with cat litter, dirt, coffee grounds, or other unwanted substance. Seal the mixture in a bag or container. Put it in the trash. NOTE: This sheet is a summary. It may not cover all possible information. If you have questions about this medicine, talk to your doctor, pharmacist, or health care provider.  2023 Elsevier/Gold Standard (2005-01-31 00:00:00)  

## 2022-11-22 NOTE — Progress Notes (Signed)
Absolute monocytes are borderline elevated. Rest of CBC WNL.

## 2022-11-23 LAB — COMPLETE METABOLIC PANEL WITH GFR
AG Ratio: 1.5 (calc) (ref 1.0–2.5)
ALT: 17 U/L (ref 6–29)
AST: 20 U/L (ref 10–35)
Albumin: 4.3 g/dL (ref 3.6–5.1)
Alkaline phosphatase (APISO): 33 U/L — ABNORMAL LOW (ref 37–153)
BUN: 13 mg/dL (ref 7–25)
CO2: 29 mmol/L (ref 20–32)
Calcium: 9.6 mg/dL (ref 8.6–10.4)
Chloride: 102 mmol/L (ref 98–110)
Creat: 0.87 mg/dL (ref 0.50–1.05)
Globulin: 2.9 g/dL (calc) (ref 1.9–3.7)
Glucose, Bld: 70 mg/dL (ref 65–99)
Potassium: 4.2 mmol/L (ref 3.5–5.3)
Sodium: 138 mmol/L (ref 135–146)
Total Bilirubin: 0.5 mg/dL (ref 0.2–1.2)
Total Protein: 7.2 g/dL (ref 6.1–8.1)
eGFR: 74 mL/min/{1.73_m2} (ref >=60)

## 2022-11-23 LAB — CBC WITH DIFFERENTIAL/PLATELET
Absolute Monocytes: 979 cells/uL — ABNORMAL HIGH (ref 200–950)
Basophils Absolute: 43 cells/uL (ref 0–200)
Basophils Relative: 0.6 %
Eosinophils Absolute: 137 cells/uL (ref 15–500)
Eosinophils Relative: 1.9 %
HCT: 37.6 % (ref 35.0–45.0)
Hemoglobin: 13 g/dL (ref 11.7–15.5)
Lymphs Abs: 1310 cells/uL (ref 850–3900)
MCH: 31.4 pg (ref 27.0–33.0)
MCHC: 34.6 g/dL (ref 32.0–36.0)
MCV: 90.8 fL (ref 80.0–100.0)
MPV: 9.1 fL (ref 7.5–12.5)
Monocytes Relative: 13.6 %
Neutro Abs: 4730 cells/uL (ref 1500–7800)
Neutrophils Relative %: 65.7 %
Platelets: 308 10*3/uL (ref 140–400)
RBC: 4.14 10*6/uL (ref 3.80–5.10)
RDW: 12.4 % (ref 11.0–15.0)
Total Lymphocyte: 18.2 %
WBC: 7.2 10*3/uL (ref 3.8–10.8)

## 2022-11-23 NOTE — Progress Notes (Signed)
Alk phos is borderline low. Rest of CMP WNL.

## 2023-02-13 NOTE — Progress Notes (Unsigned)
Office Visit Note  Patient: Melinda Sparks             Date of Birth: 1956-10-16           MRN: [id number]             PCP: Bernerd Limbo, MD Referring: Bernerd Limbo, MD Visit Date: 02/27/2023 Occupation: @GUAROCC @  Subjective:  Arthralgias  History of Present Illness: Melinda Sparks is a 67 y.o. female with history of seropositive rheumatoid arthritis, osteoarthritis, and osteoporosis.  She is currently taking plaquenil 200 mg 1 tablet by mouth daily. She has been tolerating plaquenil without any side effects.  She has not missed any doses recently.  She has noticed improvement in her symptoms since reinitiating Plaquenil after her last office visit.  She continues to have some pain and stiffness in both shoulders, both hands, both feet but she has noticed less joint swelling while taking Plaquenil.  According to the patient she recently had an updated Plaquenil eye examination.   Activities of Daily Living:  Patient reports morning stiffness for 0 minute.   Patient Denies nocturnal pain.  Difficulty dressing/grooming: Denies Difficulty climbing stairs: Reports Difficulty getting out of chair: Denies Difficulty using hands for taps, buttons, cutlery, and/or writing: Reports  Review of Systems  Constitutional:  Positive for fatigue.  HENT:  Negative for mouth sores and mouth dryness.   Eyes:  Positive for dryness.  Respiratory:  Negative for shortness of breath.   Cardiovascular:  Negative for chest pain and palpitations.  Gastrointestinal:  Negative for blood in stool, constipation and diarrhea.  Endocrine: Negative for increased urination.  Genitourinary:  Negative for involuntary urination.  Musculoskeletal:  Positive for joint pain, gait problem, joint pain, joint swelling, myalgias, muscle weakness, muscle tenderness and myalgias. Negative for morning stiffness.  Skin:  Positive for rash and sensitivity to sunlight. Negative for color change and hair loss.   Allergic/Immunologic: Negative for susceptible to infections.  Neurological:  Positive for dizziness and headaches.  Hematological:  Negative for swollen glands.  Psychiatric/Behavioral:  Positive for sleep disturbance. Negative for depressed mood. The patient is nervous/anxious.     PMFS History:  Patient Active Problem List   Diagnosis Date Noted   Primary osteoarthritis of both feet 07/24/2017   Primary osteoarthritis of both knees 07/24/2017   Fracture of metacarpal neck of right hand, closed 05/01/2017   High risk medication use 03/15/2017   Primary osteoarthritis of both hands 03/15/2017   Pain in right hand 03/15/2017   Age-related osteoporosis without current pathological fracture 03/15/2017   Rheumatoid arthritis involving multiple sites with positive rheumatoid factor + CCP MTP Erosions ([place]) 11/13/2016    Past Medical History:  Diagnosis Date   Allergy    Family history of colon cancer in father    Hyperlipidemia    Hypothyroidism    Rheumatoid arthritis, adult ([place])    Seizures ([place])    epilepsy since teenager. - last seizure 3 months ago -02-2019 per husnabd at [facility] 05-20-2019   Thyroid disease     Family History  Problem Relation Age of Onset   Breast cancer Mother    Hypertension Mother    Colon cancer Father 21   Colon polyps Neg Hx    Esophageal cancer Neg Hx    Rectal cancer Neg Hx    Stomach cancer Neg Hx    Past Surgical History:  Procedure Laterality Date   APPENDECTOMY     CLOSED REDUCTION METACARPAL WITH PERCUTANEOUS PINNING Right  05/01/2017   Procedure: CLOSED REDUCTION METACARPAL WITH PERCUTANEOUS PINNING;  Surgeon: Garald Balding, MD;  Location: [facility];  Service: Orthopedics;  Laterality: Right;   COLONOSCOPY     POLYPECTOMY     Social History   Social History Narrative   Not on file   Immunization History  Administered Date(s) Administered   Influenza-Unspecified 09/20/2017   Janssen (J&J) SARS-COV-2 Vaccination 02/15/2020   Moderna  Sars-Covid-2 Vaccination 10/12/2020   Pneumococcal Polysaccharide-23 11/20/2011   Tdap 10/09/2017     Objective: Vital Signs: BP 119/74 (BP Location: Left Arm, Patient Position: Sitting, Cuff Size: Normal)   Pulse 70   Resp 16   Ht 5\' 7"  (1.702 m)   Wt 104 lb (47.2 kg)   BMI 16.29 kg/m    Physical Exam Vitals and nursing note reviewed.  Constitutional:      Appearance: She is well-developed.  HENT:     Head: Normocephalic and atraumatic.  Eyes:     Conjunctiva/sclera: Conjunctivae normal.  Cardiovascular:     Rate and Rhythm: Normal rate and regular rhythm.     Heart sounds: Normal heart sounds.  Pulmonary:     Effort: Pulmonary effort is normal.     Breath sounds: Normal breath sounds.  Abdominal:     General: Bowel sounds are normal.     Palpations: Abdomen is soft.  Musculoskeletal:     Cervical back: Normal range of motion.  Lymphadenopathy:     Cervical: No cervical adenopathy.  Skin:    General: Skin is warm and dry.     Capillary Refill: Capillary refill takes less than 2 seconds.  Neurological:     Mental Status: She is alert and oriented to person, place, and time.  Psychiatric:        Behavior: Behavior normal.      Musculoskeletal Exam: C-spine limited ROM. Thoracic spine and lumbar spine good ROM.  Shoulder joints, elbow joints, and wrist joints have good ROM with no discomfort. PIP and DIP thickening consistent with OA of both hands.  Hip joints have good ROM with no groin pain. Knee joints have good ROM with no warmth or effusion.  Ankle joints have good ROM with no tenderness or swelling.   CDAI Exam: CDAI Score: -- Patient Global: 3 mm; Provider Global: 3 mm Swollen: --; Tender: -- Joint Exam 02/27/2023   No joint exam has been documented for this visit   There is currently no information documented on the homunculus. Go to the Rheumatology activity and complete the homunculus joint exam.  Investigation: No additional findings.  Imaging: No  results found.  Recent Labs: Lab Results  Component Value Date   WBC 7.2 11/22/2022   HGB 13.0 11/22/2022   PLT 308 11/22/2022   NA 138 11/22/2022   K 4.2 11/22/2022   CL 102 11/22/2022   CO2 29 11/22/2022   GLUCOSE 70 11/22/2022   BUN 13 11/22/2022   CREATININE 0.87 11/22/2022   BILITOT 0.5 11/22/2022   ALKPHOS 34 07/26/2017   AST 20 11/22/2022   ALT 17 11/22/2022   PROT 7.2 11/22/2022   ALBUMIN 4.5 07/26/2017   CALCIUM 9.6 11/22/2022   GFRAA 74 06/07/2021    Speciality Comments: PLQ Eye Exam: 12/13/2021 WNL @ [facility] follow up in 1 year Prior therapy includes: MTX (elevated creatinine)  Procedures:  No procedures performed Allergies: Felbamate, Tiagabine, and Zonisamide    Assessment / Plan:     Visit Diagnoses: Rheumatoid arthritis involving multiple sites with  positive rheumatoid factor + CCP MTP Erosions ([place]): She has no synovitis on examination today.  She has clinically doing better taking Plaquenil 200 mg 1 tablet by mouth daily.  She is tolerating Plaquenil without any side effects and has not missed any doses recently.  She has noticed less joint swelling in both hands and both feet since reinitiating Plaquenil as prescribed.  She continues to have occasional discomfort and stiffness in both hands and both feet likely due to underlying osteoarthritis.  She has also had intermittent stiffness in both shoulders but has full range of motion on examination today.  Plan to continue Plaquenil as prescribed.  A refill was sent to the pharmacy today.  She was advised to notify us if she develops signs or symptoms of a flare.  She will follow-up in the office in 5 months or sooner if needed.  High risk medication use - Plaquenil 200 mg 1 tablet by mouth daily.  CBC and CMP updated on 11/22/22.  Orders for CBC and CMP were released today. PLQ Eye Exam: 12/13/2021 WNL @ [facility] follow up in 1 year.  According to the patient she had a recent black eye  examination so we will call to obtain these records. - Plan: CBC with Differential/Platelet, COMPLETE METABOLIC PANEL WITH GFR  Primary osteoarthritis of both hands: PIP and DIP thickening consistent with osteoarthritis of both hands.  No synovitis noted today.  CMC joint prominence noted bilaterally.  Discussed the importance of joint protection and muscle strengthening.  She was given a prescription for arthritis compression gloves.  Discussed the importance of joint protection and muscle strengthening.  She was given a handout of exercises to perform.  Primary osteoarthritis of both knees: Good range of motion of both knee joints on examination today.  No warmth or effusion noted.  Encouraged patient to try chair yoga.  Primary osteoarthritis of both feet: Good range of motion of both ankle joints with no tenderness or synovitis.  She continues to experience intermittent pain and stiffness in both feet, but she has noticed less joint swelling since reinitiating Plaquenil.  Age-related osteoporosis without current pathological fracture-Under the care of PCP.  Updated DEXA on 12/15/2022 left femoral neck BMD 0.519 with T-score -3.0.  Left total hip BMD 0.550 with T-score -3.2.  Lumbar spine BMD 0.846 with T-score -1.8.   Previous DEXA on 10/01/19: The BMD measured at Femur Neck Right is 0.616 g/cm2 with a T-score of -3.0. She is taking calcium and vitamin D supplement daily.  Other medical conditions are listed as follows:  History of hyperlipidemia  History of anxiety  History of hypothyroidism  History of seizure disorder  Orders: Orders Placed This Encounter  Procedures   CBC with Differential/Platelet   COMPLETE METABOLIC PANEL WITH GFR   Meds ordered this encounter  Medications   hydroxychloroquine (PLAQUENIL) 200 MG tablet    Sig: Take 1 tablet (200 mg total) by mouth daily.    Dispense:  90 tablet    Refill:  0     Follow-Up Instructions: Return in about 5 months (around  07/30/2023) for Rheumatoid arthritis, Osteoarthritis.   Ofilia Neas, PA-C  Note - This record has been created using Dragon software.  Chart creation errors have been sought, but may not always  have been located. Such creation errors do not reflect on  the standard of medical care.

## 2023-02-27 ENCOUNTER — Encounter: Payer: Self-pay | Admitting: Physician Assistant

## 2023-02-27 ENCOUNTER — Ambulatory Visit: Payer: Medicare Other | Attending: Physician Assistant | Admitting: Physician Assistant

## 2023-02-27 VITALS — BP 119/74 | HR 70 | Resp 16 | Ht 67.0 in | Wt 104.0 lb

## 2023-02-27 DIAGNOSIS — Z79899 Other long term (current) drug therapy: Secondary | ICD-10-CM | POA: Diagnosis not present

## 2023-02-27 DIAGNOSIS — M170 Bilateral primary osteoarthritis of knee: Secondary | ICD-10-CM | POA: Diagnosis not present

## 2023-02-27 DIAGNOSIS — Z8639 Personal history of other endocrine, nutritional and metabolic disease: Secondary | ICD-10-CM

## 2023-02-27 DIAGNOSIS — M19041 Primary osteoarthritis, right hand: Secondary | ICD-10-CM | POA: Diagnosis not present

## 2023-02-27 DIAGNOSIS — M19042 Primary osteoarthritis, left hand: Secondary | ICD-10-CM

## 2023-02-27 DIAGNOSIS — M0579 Rheumatoid arthritis with rheumatoid factor of multiple sites without organ or systems involvement: Secondary | ICD-10-CM | POA: Diagnosis not present

## 2023-02-27 DIAGNOSIS — M810 Age-related osteoporosis without current pathological fracture: Secondary | ICD-10-CM

## 2023-02-27 DIAGNOSIS — Z8659 Personal history of other mental and behavioral disorders: Secondary | ICD-10-CM

## 2023-02-27 DIAGNOSIS — Z8669 Personal history of other diseases of the nervous system and sense organs: Secondary | ICD-10-CM

## 2023-02-27 DIAGNOSIS — M19072 Primary osteoarthritis, left ankle and foot: Secondary | ICD-10-CM

## 2023-02-27 DIAGNOSIS — M19071 Primary osteoarthritis, right ankle and foot: Secondary | ICD-10-CM

## 2023-02-27 MED ORDER — HYDROXYCHLOROQUINE SULFATE 200 MG PO TABS: 200.0000 mg | ORAL_TABLET | Freq: Every day | ORAL | 0 refills | Status: DC

## 2023-02-27 NOTE — Patient Instructions (Signed)
Hand Exercises Hand exercises can be helpful for almost anyone. These exercises can strengthen the hands, improve flexibility and movement, and increase blood flow to the hands. These results can make work and daily tasks easier. Hand exercises can be especially helpful for people who have joint pain from arthritis or have nerve damage from overuse (carpal tunnel syndrome). These exercises can also help people who have injured a hand. Exercises Most of these hand exercises are gentle stretching and motion exercises. It is usually safe to do them often throughout the day. Warming up your hands before exercise may help to reduce stiffness. You can do this with gentle massage or by placing your hands in warm water for 10-15 minutes. It is normal to feel some stretching, pulling, tightness, or mild discomfort as you begin new exercises. This will gradually improve. Stop an exercise right away if you feel sudden, severe pain or your pain gets worse. Ask your health care provider which exercises are best for you. Knuckle bend or "claw" fist  Stand or sit with your arm, hand, and all five fingers pointed straight up. Make sure to keep your wrist straight during the exercise. Gently bend your fingers down toward your palm until the tips of your fingers are touching the top of your palm. Keep your big knuckle straight and just bend the small knuckles in your fingers. Hold this position for __________ seconds. Straighten (extend) your fingers back to the starting position. Repeat this exercise 5-10 times with each hand. Full finger fist  Stand or sit with your arm, hand, and all five fingers pointed straight up. Make sure to keep your wrist straight during the exercise. Gently bend your fingers into your palm until the tips of your fingers are touching the middle of your palm. Hold this position for __________ seconds. Extend your fingers back to the starting position, stretching every joint fully. Repeat  this exercise 5-10 times with each hand. Straight fist Stand or sit with your arm, hand, and all five fingers pointed straight up. Make sure to keep your wrist straight during the exercise. Gently bend your fingers at the big knuckle, where your fingers meet your hand, and the middle knuckle. Keep the knuckle at the tips of your fingers straight and try to touch the bottom of your palm. Hold this position for __________ seconds. Extend your fingers back to the starting position, stretching every joint fully. Repeat this exercise 5-10 times with each hand. Tabletop  Stand or sit with your arm, hand, and all five fingers pointed straight up. Make sure to keep your wrist straight during the exercise. Gently bend your fingers at the big knuckle, where your fingers meet your hand, as far down as you can while keeping the small knuckles in your fingers straight. Think of forming a tabletop with your fingers. Hold this position for __________ seconds. Extend your fingers back to the starting position, stretching every joint fully. Repeat this exercise 5-10 times with each hand. Finger spread  Place your hand flat on a table with your palm facing down. Make sure your wrist stays straight as you do this exercise. Spread your fingers and thumb apart from each other as far as you can until you feel a gentle stretch. Hold this position for __________ seconds. Bring your fingers and thumb tight together again. Hold this position for __________ seconds. Repeat this exercise 5-10 times with each hand. Making circles  Stand or sit with your arm, hand, and all five fingers pointed   straight up. Make sure to keep your wrist straight during the exercise. Make a circle by touching the tip of your thumb to the tip of your index finger. Hold for __________ seconds. Then open your hand wide. Repeat this motion with your thumb and each finger on your hand. Repeat this exercise 5-10 times with each hand. Thumb  motion  Sit with your forearm resting on a table and your wrist straight. Your thumb should be facing up toward the ceiling. Keep your fingers relaxed as you move your thumb. Lift your thumb up as high as you can toward the ceiling. Hold for __________ seconds. Bend your thumb across your palm as far as you can, reaching the tip of your thumb for the small finger (pinkie) side of your palm. Hold for __________ seconds. Repeat this exercise 5-10 times with each hand. Grip strengthening  Hold a stress ball or other soft ball in the middle of your hand. Slowly increase the pressure, squeezing the ball as much as you can without causing pain. Think of bringing the tips of your fingers into the middle of your palm. All of your finger joints should bend when doing this exercise. Hold your squeeze for __________ seconds, then relax. Repeat this exercise 5-10 times with each hand. Contact a health care provider if: Your hand pain or discomfort gets much worse when you do an exercise. Your hand pain or discomfort does not improve within 2 hours after you exercise. If you have any of these problems, stop doing these exercises right away. Do not do them again unless your health care provider says that you can. Get help right away if: You develop sudden, severe hand pain or swelling. If this happens, stop doing these exercises right away. Do not do them again unless your health care provider says that you can. This information is not intended to replace advice given to you by your health care provider. Make sure you discuss any questions you have with your health care provider. Document Revised: 03/03/2021 Document Reviewed: 03/10/2021 Elsevier Patient Education  2023 Elsevier Inc.  

## 2023-02-28 LAB — COMPLETE METABOLIC PANEL WITH GFR
AG Ratio: 1.6 (calc) (ref 1.0–2.5)
ALT: 17 U/L (ref 6–29)
AST: 23 U/L (ref 10–35)
Albumin: 4.5 g/dL (ref 3.6–5.1)
Alkaline phosphatase (APISO): 36 U/L — ABNORMAL LOW (ref 37–153)
BUN: 19 mg/dL (ref 7–25)
CO2: 28 mmol/L (ref 20–32)
Calcium: 10.2 mg/dL (ref 8.6–10.4)
Chloride: 103 mmol/L (ref 98–110)
Creat: 0.94 mg/dL (ref 0.50–1.05)
Globulin: 2.9 g/dL (calc) (ref 1.9–3.7)
Glucose, Bld: 84 mg/dL (ref 65–99)
Potassium: 4.2 mmol/L (ref 3.5–5.3)
Sodium: 139 mmol/L (ref 135–146)
Total Bilirubin: 0.5 mg/dL (ref 0.2–1.2)
Total Protein: 7.4 g/dL (ref 6.1–8.1)
eGFR: 67 mL/min/{1.73_m2} (ref >=60)

## 2023-02-28 LAB — CBC WITH DIFFERENTIAL/PLATELET
Absolute Monocytes: 640 cells/uL (ref 200–950)
Basophils Absolute: 70 cells/uL (ref 0–200)
Basophils Relative: 1.4 %
Eosinophils Absolute: 100 cells/uL (ref 15–500)
Eosinophils Relative: 2 %
HCT: 39.6 % (ref 35.0–45.0)
Hemoglobin: 13.3 g/dL (ref 11.7–15.5)
Lymphs Abs: 1200 cells/uL (ref 850–3900)
MCH: 30 pg (ref 27.0–33.0)
MCHC: 33.6 g/dL (ref 32.0–36.0)
MCV: 89.4 fL (ref 80.0–100.0)
MPV: 9.2 fL (ref 7.5–12.5)
Monocytes Relative: 12.8 %
Neutro Abs: 2990 cells/uL (ref 1500–7800)
Neutrophils Relative %: 59.8 %
Platelets: 299 10*3/uL (ref 140–400)
RBC: 4.43 10*6/uL (ref 3.80–5.10)
RDW: 12.2 % (ref 11.0–15.0)
Total Lymphocyte: 24 %
WBC: 5 10*3/uL (ref 3.8–10.8)

## 2023-02-28 NOTE — Progress Notes (Signed)
Alk phos is borderline low but has improved. Rest of CMP WNL.  CBC WNL.

## 2023-03-03 ENCOUNTER — Other Ambulatory Visit: Payer: Self-pay | Admitting: Physician Assistant

## 2023-05-25 ENCOUNTER — Other Ambulatory Visit: Payer: Self-pay | Admitting: Physician Assistant

## 2023-05-29 ENCOUNTER — Other Ambulatory Visit: Payer: Self-pay | Admitting: Physician Assistant

## 2023-06-01 ENCOUNTER — Telehealth: Payer: Self-pay | Admitting: *Deleted

## 2023-06-01 NOTE — Telephone Encounter (Signed)
Patient reached out to the office regarding her PLQ prescription. Patient advised we received office notes from her eye doctor. It does not states anything regarding a PLQ eye exam. I have faxed our form to the eye doctor to have them complete. I faxed it again today with an explanation as to why we need it completed.

## 2023-06-04 ENCOUNTER — Telehealth: Payer: Self-pay | Admitting: *Deleted

## 2023-06-04 NOTE — Telephone Encounter (Signed)
Patient contacted the office to see if we have received what we needed in order to fill her prescription. Patient advised we have not received our PLQ eye exam form. Patient advised we have faxed it to them again with an explanation of what was needed. Patient advised they may not have done that testing. Patient advised she needs to have a visual field and an OCT. Patient asked if she should see another eye doctor. Patient advised if her current eye doctor has not done this testing it is preferred she see an ophthalmologist. Patient expressed understanding.

## 2023-06-06 ENCOUNTER — Other Ambulatory Visit: Payer: Self-pay | Admitting: *Deleted

## 2023-06-25 ENCOUNTER — Other Ambulatory Visit: Payer: Self-pay | Admitting: *Deleted

## 2023-06-25 MED ORDER — HYDROXYCHLOROQUINE SULFATE 200 MG PO TABS: 200.0000 mg | ORAL_TABLET | Freq: Every day | ORAL | 0 refills | Status: AC

## 2023-06-25 NOTE — Telephone Encounter (Signed)
Last Fill: 02/27/2023  Eye exam: 06/21/2023 WNL   Labs: 02/27/2023 Alk phos is borderline low but has improved. Rest of CMP WNL. CBC WNL.  Next Visit: 07/31/2023  Last Visit: 02/27/2023  DX: Rheumatoid arthritis involving multiple sites with positive rheumatoid factor   Current Dose per office note 02/27/2023: Plaquenil 200 mg 1 tablet by mouth daily.   Okay to refill Plaquenil?

## 2023-07-04 ENCOUNTER — Encounter: Payer: Self-pay | Admitting: Rheumatology

## 2023-07-10 ENCOUNTER — Other Ambulatory Visit: Payer: Self-pay | Admitting: *Deleted

## 2023-07-10 DIAGNOSIS — Z79899 Other long term (current) drug therapy: Secondary | ICD-10-CM

## 2023-07-10 LAB — CBC WITH DIFFERENTIAL/PLATELET
Absolute Monocytes: 526 cells/uL (ref 200–950)
Basophils Absolute: 38 cells/uL (ref 0–200)
Basophils Relative: 0.8 %
Eosinophils Absolute: 99 cells/uL (ref 15–500)
Eosinophils Relative: 2.1 %
HCT: 37.2 % (ref 35.0–45.0)
Hemoglobin: 12.8 g/dL (ref 11.7–15.5)
Lymphs Abs: 954 cells/uL (ref 850–3900)
MCH: 31.1 pg (ref 27.0–33.0)
MCHC: 34.4 g/dL (ref 32.0–36.0)
MCV: 90.3 fL (ref 80.0–100.0)
MPV: 9.4 fL (ref 7.5–12.5)
Monocytes Relative: 11.2 %
Neutro Abs: 3083 cells/uL (ref 1500–7800)
Neutrophils Relative %: 65.6 %
Platelets: 283 10*3/uL (ref 140–400)
RBC: 4.12 10*6/uL (ref 3.80–5.10)
RDW: 12.2 % (ref 11.0–15.0)
Total Lymphocyte: 20.3 %
WBC: 4.7 10*3/uL (ref 3.8–10.8)

## 2023-07-11 NOTE — Progress Notes (Signed)
CBC and CMP are normal.

## 2023-07-17 NOTE — Progress Notes (Signed)
Office Visit Note  Patient: Melinda Sparks             Date of Birth: 1956/04/12           MRN: [id number]             PCP: Tracey Harries, MD Referring: Tracey Harries, MD Visit Date: 07/31/2023 Occupation: @GUAROCC @  Subjective:  Pain and stiffness in hands   History of Present Illness: Melinda Sparks is a 67 y.o. female with seropositive rheumatoid arthritis, osteoarthritis and osteoporosis.  She returns today with her husband Thayer Ohm, after her last visit on February 27, 2023.  They relay that she has significant memory loss and forgetfulness.  She has been taking hydroxychloroquine 200 mg p.o. daily.  Her last eye examination was on June 21, 2023, with Dr. Dione Booze.   She endorses sore knees bilaterally.  She has been trying to exercise more, via videos on TV.  She does note an increase in pain and swelling in her hands for the last six months.   She performs  hand exercises. .  She does notice that warm dishwater tends to make her hands feel better, in addition to movement.    She endorses slight stiffness in her L foot and ankle, but she says it feels fine.  She has an upcoming DEXA scan in the near future.  Followed by PCP for Osteoporosis.   She has recently stopped taking her Levothyroxine by recommendation of her PCP.    Activities of Daily Living:  Patient reports morning stiffness for less than 5  minutes.   Patient Denies nocturnal pain.  Difficulty dressing/grooming: Reports Difficulty climbing stairs: Denies Difficulty getting out of chair: Denies Difficulty using hands for taps, buttons, cutlery, and/or writing: Denies  Review of Systems  Constitutional:  Positive for fatigue.  HENT:  Negative for mouth sores and mouth dryness.   Eyes:  Negative for dryness.  Respiratory:  Negative for shortness of breath.   Cardiovascular:  Negative for chest pain and palpitations.  Gastrointestinal:  Negative for blood in stool, constipation and diarrhea.  Endocrine: Negative for  increased urination.  Genitourinary:  Negative for involuntary urination.  Musculoskeletal:  Positive for joint swelling, myalgias, morning stiffness and myalgias. Negative for joint pain, gait problem, joint pain, muscle weakness and muscle tenderness.  Skin:  Positive for rash. Negative for color change, hair loss and sensitivity to sunlight.  Allergic/Immunologic: Negative for susceptible to infections.  Neurological:  Positive for dizziness. Negative for headaches.  Hematological:  Negative for swollen glands.  Psychiatric/Behavioral:  Positive for sleep disturbance. Negative for depressed mood. The patient is not nervous/anxious.     PMFS History:  Patient Active Problem List   Diagnosis Date Noted   Primary osteoarthritis of both feet 07/24/2017   Primary osteoarthritis of both knees 07/24/2017   Fracture of metacarpal neck of right hand, closed 05/01/2017   High risk medication use 03/15/2017   Primary osteoarthritis of both hands 03/15/2017   Pain in right hand 03/15/2017   Age-related osteoporosis without current pathological fracture 03/15/2017   Rheumatoid arthritis involving multiple sites with positive rheumatoid factor + CCP MTP Erosions (HCC) 11/13/2016    Past Medical History:  Diagnosis Date   Allergy    Family history of colon cancer in father    Hyperlipidemia    Hypothyroidism    Rheumatoid arthritis, adult (HCC)    Seizures (HCC)    epilepsy since teenager. - last seizure 3 months ago -02-2019 per husnabd  at [facility] 05-20-2019   Thyroid disease     Family History  Problem Relation Age of Onset   Breast cancer Mother    Hypertension Mother    Dementia Mother    Colon cancer Father 102   Colon polyps Neg Hx    Esophageal cancer Neg Hx    Rectal cancer Neg Hx    Stomach cancer Neg Hx    Past Surgical History:  Procedure Laterality Date   APPENDECTOMY     CLOSED REDUCTION METACARPAL WITH PERCUTANEOUS PINNING Right 05/01/2017   Procedure: CLOSED REDUCTION  METACARPAL WITH PERCUTANEOUS PINNING;  Surgeon: Valeria Batman, MD;  Location: [facility] OR;  Service: Orthopedics;  Laterality: Right;   COLONOSCOPY     POLYPECTOMY     Social History   Social History Narrative   Not on file   Immunization History  Administered Date(s) Administered   Influenza-Unspecified 09/20/2017   Janssen (J&J) SARS-COV-2 Vaccination 02/15/2020   Moderna Sars-Covid-2 Vaccination 10/12/2020   Pneumococcal Polysaccharide-23 11/20/2011   Tdap 10/09/2017     Objective: Vital Signs: BP 121/66 (BP Location: Left Arm, Patient Position: Sitting, Cuff Size: Normal)   Pulse 78   Resp 13   Ht 5\' 4"  (1.626 m)   Wt 109 lb 6.4 oz (49.6 kg)   BMI 18.78 kg/m    Physical Exam Constitutional:      Appearance: Normal appearance. She is normal weight.  HENT:     Head: Normocephalic and atraumatic.  Eyes:     Extraocular Movements: Extraocular movements intact.  Cardiovascular:     Rate and Rhythm: Normal rate and regular rhythm.     Heart sounds: Normal heart sounds.  Pulmonary:     Effort: Pulmonary effort is normal.     Breath sounds: Normal breath sounds.  Abdominal:     General: Abdomen is flat.     Palpations: Abdomen is soft.  Musculoskeletal:     Cervical back: Normal range of motion and neck supple.  Skin:    General: Skin is warm and dry.     Capillary Refill: Capillary refill takes less than 2 seconds.  Neurological:     Mental Status: She is alert.     Comments: Forgetful, Husband Thayer Ohm is present to help with history, medication  Psychiatric:        Mood and Affect: Mood normal.      Musculoskeletal Exam: C-spine limited ROM. Thoracic spine and lumbar spine good ROM.  Shoulder joints, elbow joints, and wrist joints have good ROM with no discomfort. PIP and DIP thickening consistent with OA of both hands, ulnar deviation bilaterally.  No synovitis was noted.  Hip joints have good ROM with no groin pain. Knee joints have good ROM with no warmth or  effusion.  Ankle joints have good ROM with no tenderness or swelling.    CDAI Exam: CDAI Score: 4  Patient Global: 20 / 100; Provider Global: 10 / 100 Swollen: 0 ; Tender: 2  Joint Exam 07/31/2023      Right  Left  Wrist   Tender     CMC      Tender     Investigation: No additional findings.  Imaging: No results found.  Recent Labs: Lab Results  Component Value Date   WBC 4.7 07/10/2023   HGB 12.8 07/10/2023   PLT 283 07/10/2023   NA 142 07/10/2023   K 3.9 07/10/2023   CL 104 07/10/2023   CO2 30 07/10/2023   GLUCOSE 87 07/10/2023  BUN 21 07/10/2023   CREATININE 0.95 07/10/2023   BILITOT 0.4 07/10/2023   ALKPHOS 34 07/26/2017   AST 19 07/10/2023   ALT 15 07/10/2023   PROT 6.7 07/10/2023   ALBUMIN 4.5 07/26/2017   CALCIUM 9.9 07/10/2023   GFRAA 74 06/07/2021    Speciality Comments: PLQ Eye Exam: 06/21/2023 WNL @ [facility] follow up in 1 year Prior therapy includes: MTX (elevated creatinine)  Procedures:  No procedures performed Allergies: Felbamate, Tiagabine, and Zonisamide    Assessment / Plan:     Visit Diagnoses: Rheumatoid arthritis involving multiple sites with positive rheumatoid factor + CCP MTP Erosions (HCC): She had no synovitis on examination today.  She continues to take Plaquenil 200 mg 1 tablet by mouth daily. She is tolerating Plaquenil without any side effects and has not missed any doses recently.  Her last eye examination was on June 21, 2023, with Dr. Dione Booze.  She endorses sore knees bilaterally.  No warmth swelling or effusion was noted.  She has intermittent discomfort in her hands and her feet.  No synovitis was noted.  High risk medication use - Plaquenil 200 mg 1 tablet by mouth daily. PLQ Eye Exam: 06/21/2023.  She continues to take Plaquenil daily without interruption and without side effects.  Labs obtained on July 10, 2023 CBC and CMP were normal.  Will continue to monitor labs every 5 months.  Information on immunization was  placed in the AVS.  Primary osteoarthritis of both hands - Patient endorses increased pain in her hands for the last six months.    Warm dishwater helps.  PIP and DIP thickening consistent with osteoarthritis of both hands. No synovitis noted today. CMC joint prominence noted bilaterally. Discussed the importance of joint protection and muscle strengthening.   Primary osteoarthritis of both knees - She does endorse mild discomfort bilaterally.  Good range of motion of both knee joints on examination today. No warmth or effusion noted.   Primary osteoarthritis of both feet - Good range of motion of both ankle joints with no tenderness or synovitis. She continues to experience intermittent stiffness in both feet but with minimal pain.  Age-related osteoporosis without current pathological fracture - DEXA  12/15/2022 left femoral neck BMD 0.519 with T-score -3.0. Left total hip BMD 0.550 with T-score -3.2.   Patient does not appear to be taking any medications.  I advised her to discuss this further with Dr. Everlene Other.  I briefly discussed possible use of alendronate.  A handout was given for review.  Use of calcium rich diet and vitamin D was advised.  She is at increased risk of developing osteoporosis that she is taking antiepileptic medications.  Other medical problems are listed as follows:  History of anxiety  History of hyperlipidemia  History of hypothyroidism  History of seizure disorder  Orders: No orders of the defined types were placed in this encounter.  No orders of the defined types were placed in this encounter.    Follow-Up Instructions: Return in about 5 months (around 12/31/2023) for Rheumatoid arthritis.   Pollyann Savoy, MD  Note - This record has been created using Animal nutritionist.  Chart creation errors have been sought, but may not always  have been located. Such creation errors do not reflect on  the standard of medical care.

## 2023-07-31 ENCOUNTER — Encounter: Payer: Self-pay | Admitting: Rheumatology

## 2023-07-31 ENCOUNTER — Ambulatory Visit: Payer: Medicare Other | Attending: Rheumatology | Admitting: Rheumatology

## 2023-07-31 VITALS — BP 121/66 | HR 78 | Resp 13 | Ht 64.0 in | Wt 109.4 lb

## 2023-07-31 DIAGNOSIS — Z8639 Personal history of other endocrine, nutritional and metabolic disease: Secondary | ICD-10-CM

## 2023-07-31 DIAGNOSIS — M810 Age-related osteoporosis without current pathological fracture: Secondary | ICD-10-CM

## 2023-07-31 DIAGNOSIS — M0579 Rheumatoid arthritis with rheumatoid factor of multiple sites without organ or systems involvement: Secondary | ICD-10-CM

## 2023-07-31 DIAGNOSIS — Z8669 Personal history of other diseases of the nervous system and sense organs: Secondary | ICD-10-CM

## 2023-07-31 DIAGNOSIS — M19041 Primary osteoarthritis, right hand: Secondary | ICD-10-CM

## 2023-07-31 DIAGNOSIS — Z79899 Other long term (current) drug therapy: Secondary | ICD-10-CM

## 2023-07-31 DIAGNOSIS — M19071 Primary osteoarthritis, right ankle and foot: Secondary | ICD-10-CM

## 2023-07-31 DIAGNOSIS — M170 Bilateral primary osteoarthritis of knee: Secondary | ICD-10-CM | POA: Diagnosis not present

## 2023-07-31 DIAGNOSIS — M19042 Primary osteoarthritis, left hand: Secondary | ICD-10-CM

## 2023-07-31 DIAGNOSIS — Z8659 Personal history of other mental and behavioral disorders: Secondary | ICD-10-CM

## 2023-07-31 DIAGNOSIS — M19072 Primary osteoarthritis, left ankle and foot: Secondary | ICD-10-CM

## 2023-07-31 NOTE — Patient Instructions (Addendum)
Vaccines You are taking a medication(s) that can suppress your immune system.  The following immunizations are recommended: Flu annually Covid-19  Td/Tdap (tetanus, diphtheria, pertussis) every 10 years Pneumonia (Prevnar 15 then Pneumovax 23 at least 1 year apart.  Alternatively, can take Prevnar 20 without needing additional dose) Shingrix: 2 doses from 4 weeks to 6 months apart  Please check with your PCP to make sure you are up to date.  Alendronate Solution What is this medication? ALENDRONATE (a LEN droe nate) treats osteoporosis. It works by Interior and spatial designer stronger and less likely to break (fracture). It belongs to a group of medications called bisphosphonates. This medicine may be used for other purposes; ask your health care provider or pharmacist if you have questions. COMMON BRAND NAME(S): Fosamax What should I tell my care team before I take this medication? They need to know if you have any of these conditions: Bleeding disorder Cancer Dental disease Difficulty swallowing Infection (fever, chills, cough, sore throat, pain or trouble passing urine) Kidney disease Low levels of calcium or other minerals in the blood Low red blood cell counts Receiving steroids like dexamethasone or prednisone Stomach or intestine problems Trouble sitting or standing for 30 minutes An unusual or allergic reaction to alendronate, other medications, foods, dyes or preservatives Pregnant or trying to get pregnant Breast-feeding How should I use this medication? Take this medication by mouth with a full glass of water. Take it as directed on the prescription label at the same time every day. Use a specially marked oral syringe, spoon, or dropper to measure each dose. Ask your pharmacist if you do not have one. Household spoons are not accurate. Take the dose right after waking up. Do not eat or drink anything before taking it. Do not take it with any other drink except water. After taking it,  do not eat breakfast, drink, or take any other medications or vitamins for at least 30 minutes. Sit or stand up for at least 30 minutes after you take it. Do not lie down. Keep taking it unless your care team tells you to stop. A special MedGuide will be given to you by the pharmacist with each prescription and refill. Be sure to read this information carefully each time. Talk to your care team about the use of this medication in children. Special care may be needed. Overdosage: If you think you have taken too much of this medicine contact a poison control center or emergency room at once. NOTE: This medicine is only for you. Do not share this medicine with others. What if I miss a dose? If you take your medication once a day, skip it. Take your next dose at the scheduled time the next morning. Do not take two doses on the same day. If you take your medication once a week, take the missed dose on the morning after you remember. Do not take two doses on the same day. What may interact with this medication? Aluminum hydroxide Antacids Aspirin Calcium supplements Iron supplements Magnesium supplements Medications for inflammation like ibuprofen, naproxen, and others Vitamins with minerals This list may not describe all possible interactions. Give your health care provider a list of all the medicines, herbs, non-prescription drugs, or dietary supplements you use. Also tell them if you smoke, drink alcohol, or use illegal drugs. Some items may interact with your medicine. What should I watch for while using this medication? Visit your care team for regular checks on your progress. It may be some time  before you see the benefit from this medication. Some people who take this medication have severe bone, joint, or muscle pain. This medication may also increase your risk for jaw problems or a broken thigh bone. Tell your care team right away if you have severe pain in your jaw, bones, joints, or muscles.  Tell you care team if you have any pain that does not go away or that gets worse. Tell your dentist and dental surgeon that you are taking this medication. You should not have major dental surgery while on this medication. See your dentist to have a dental exam and fix any dental problems before starting this medication. Take good care of your teeth while on this medication. Make sure you see your dentist for regular follow-up appointments. You should make sure you get enough calcium and vitamin D while you are taking this medication. Discuss the foods you eat and the vitamins you take with your care team. You may need blood work done while you are taking this medication. What side effects may I notice from receiving this medication? Side effects that you should report to your care team as soon as possible: Allergic reactions--skin rash, itching, hives, swelling of the face, lips, tongue, or throat Low calcium level--muscle pain or cramps, confusion, tingling, or numbness in the hands or feet Osteonecrosis of the jaw--pain, swelling, or redness in the mouth, numbness of the jaw, poor healing after dental work, unusual discharge from the mouth, visible bones in the mouth Pain or trouble swallowing Severe bone, joint, or muscle pain Stomach bleeding--bloody or black, tar-like stools, vomiting blood or brown material that looks like coffee grounds Side effects that usually do not require medical attention (report to your care team if they continue or are bothersome): Constipation Diarrhea Nausea Stomach pain This list may not describe all possible side effects. Call your doctor for medical advice about side effects. You may report side effects to FDA at 1-800-FDA-1088. Where should I keep my medication? Keep out of the reach of children and pets. Store at room temperature between 20 and 25 degrees C (68 and 77 degrees F). Do not freeze. Throw away any unused medication after the expiration  date. NOTE: This sheet is a summary. It may not cover all possible information. If you have questions about this medicine, talk to your doctor, pharmacist, or health care provider.  2024 Elsevier/Gold Standard (2020-11-18 00:00:00)

## 2023-12-18 NOTE — Progress Notes (Signed)
Office Visit Note  Patient: Melinda Sparks             Date of Birth: 12-30-1955           MRN: [id number]             PCP: Tracey Harries, MD Referring: Tracey Harries, MD Visit Date: 01/01/2024 Occupation: @GUAROCC @  Subjective:  Medication management  History of Present Illness: Melinda Sparks is a 68 y.o. female with seropositive rheumatoid arthritis, osteoarthritis and osteoporosis.  Patient is accompanied by her husband today.  Patient states that she notices popping of her cervical spine when she turns her head.  She has been taking hydroxychloroquine 200 mg daily without any interruption.  She reports popping sensation in her right ankle.  She has not noticed any joint pain.  She has not noticed any joint swelling.    Activities of Daily Living:  Patient reports morning stiffness for 1 hour.   Patient Reports nocturnal pain.  Difficulty dressing/grooming: Denies Difficulty climbing stairs: Denies Difficulty getting out of chair: Reports Difficulty using hands for taps, buttons, cutlery, and/or writing: Reports  Review of Systems  Constitutional:  Positive for fatigue.  HENT:  Positive for mouth dryness. Negative for mouth sores.   Eyes:  Negative for dryness.  Respiratory:  Negative for shortness of breath.   Cardiovascular:  Negative for chest pain and palpitations.  Gastrointestinal:  Negative for blood in stool, constipation and diarrhea.  Endocrine: Negative for increased urination.  Genitourinary:  Negative for involuntary urination.  Musculoskeletal:  Positive for joint pain, joint pain, myalgias, muscle weakness, morning stiffness and myalgias. Negative for gait problem, joint swelling and muscle tenderness.  Skin:  Negative for color change, rash, hair loss and sensitivity to sunlight.  Allergic/Immunologic: Negative for susceptible to infections.  Neurological:  Positive for dizziness. Negative for headaches.  Hematological:  Negative for swollen glands.   Psychiatric/Behavioral:  Positive for depressed mood and sleep disturbance. The patient is nervous/anxious.     PMFS History:  Patient Active Problem List   Diagnosis Date Noted   Primary osteoarthritis of both feet 07/24/2017   Primary osteoarthritis of both knees 07/24/2017   Fracture of metacarpal neck of right hand, closed 05/01/2017   High risk medication use 03/15/2017   Primary osteoarthritis of both hands 03/15/2017   Pain in right hand 03/15/2017   Age-related osteoporosis without current pathological fracture 03/15/2017   Rheumatoid arthritis involving multiple sites with positive rheumatoid factor + CCP MTP Erosions (HCC) 11/13/2016    Past Medical History:  Diagnosis Date   Allergy    Family history of colon cancer in father    Hyperlipidemia    Hypothyroidism    Rheumatoid arthritis, adult (HCC)    Seizures (HCC)    epilepsy since teenager. - last seizure 3 months ago -02-2019 per husnabd at [facility] 05-20-2019   Thyroid disease     Family History  Problem Relation Age of Onset   Breast cancer Mother    Hypertension Mother    Alzheimer's disease Mother    Colon cancer Father 77   Colon polyps Neg Hx    Esophageal cancer Neg Hx    Rectal cancer Neg Hx    Stomach cancer Neg Hx    Past Surgical History:  Procedure Laterality Date   APPENDECTOMY     CLOSED REDUCTION METACARPAL WITH PERCUTANEOUS PINNING Right 05/01/2017   Procedure: CLOSED REDUCTION METACARPAL WITH PERCUTANEOUS PINNING;  Surgeon: Valeria Batman, MD;  Location: [facility]  OR;  Service: Orthopedics;  Laterality: Right;   COLONOSCOPY     POLYPECTOMY     Social History   Social History Narrative   Not on file   Immunization History  Administered Date(s) Administered   Influenza-Unspecified 09/20/2017   Janssen (J&J) SARS-COV-2 Vaccination 02/15/2020   Moderna Sars-Covid-2 Vaccination 10/12/2020   PFIZER(Purple Top)SARS-COV-2 Vaccination 04/15/2021   Pfizer Covid-19 Vaccine Bivalent Booster 82yrs & up  09/13/2021   Pfizer(Comirnaty)Fall Seasonal Vaccine 12 years and older 10/10/2023   Pneumococcal Polysaccharide-23 11/20/2011   Tdap 10/09/2017     Objective: Vital Signs: BP 136/85 (BP Location: Left Arm, Patient Position: Sitting, Cuff Size: Normal)   Pulse 80   Resp 14   Ht 5\' 4"  (1.626 m)   Wt 108 lb 9.6 oz (49.3 kg)   BMI 18.64 kg/m    Physical Exam Vitals and nursing note reviewed.  Constitutional:      Appearance: She is well-developed.  HENT:     Head: Normocephalic and atraumatic.  Eyes:     Conjunctiva/sclera: Conjunctivae normal.  Cardiovascular:     Rate and Rhythm: Normal rate and regular rhythm.     Heart sounds: Normal heart sounds.  Pulmonary:     Effort: Pulmonary effort is normal.     Breath sounds: Normal breath sounds.  Abdominal:     General: Bowel sounds are normal.     Palpations: Abdomen is soft.  Musculoskeletal:     Cervical back: Normal range of motion.  Lymphadenopathy:     Cervical: No cervical adenopathy.  Skin:    General: Skin is warm and dry.     Capillary Refill: Capillary refill takes less than 2 seconds.  Neurological:     Mental Status: She is alert and oriented to person, place, and time.  Psychiatric:        Behavior: Behavior normal.      Musculoskeletal Exam: Cervical spine was in good range of motion without any discomfort.  She had no point tenderness over thoracic or lumbar spine.  Shoulder joints, elbow joints, wrist joints, MCPs PIPs and DIPs were in good range of motion with no synovitis.  Bilateral PIP and DIP thickening was noted.  Hip joints and knee joints in good range of motion without any warmth swelling or effusion.  There was no tenderness over ankles or MTPs.  CDAI Exam: CDAI Score: -- Patient Global: 0 / 100; Provider Global: 0 / 100 Swollen: --; Tender: -- Joint Exam 01/01/2024   No joint exam has been documented for this visit   There is currently no information documented on the homunculus. Go to the  Rheumatology activity and complete the homunculus joint exam.  Investigation: No additional findings.  Imaging: No results found.  Recent Labs: Lab Results  Component Value Date   WBC 4.7 07/10/2023   HGB 12.8 07/10/2023   PLT 283 07/10/2023   NA 142 07/10/2023   K 3.9 07/10/2023   CL 104 07/10/2023   CO2 30 07/10/2023   GLUCOSE 87 07/10/2023   BUN 21 07/10/2023   CREATININE 0.95 07/10/2023   BILITOT 0.4 07/10/2023   ALKPHOS 34 07/26/2017   AST 19 07/10/2023   ALT 15 07/10/2023   PROT 6.7 07/10/2023   ALBUMIN 4.5 07/26/2017   CALCIUM 9.9 07/10/2023   GFRAA 74 06/07/2021    Speciality Comments: PLQ Eye Exam: 06/21/2023 WNL @ [facility] follow up in 1 year Prior therapy includes: MTX (elevated creatinine)  Procedures:  No procedures performed Allergies:  Felbamate, Tiagabine, and Zonisamide   Assessment / Plan:     Visit Diagnoses: Rheumatoid arthritis involving multiple sites with positive rheumatoid factor + CCP MTP Erosions (HCC)-patient had no synovitis on the examination.  She has been taking hydroxychloroquine 200 mg daily without any interruption.  High risk medication use - Plaquenil 200 mg 1 tablet by mouth daily. PLQ Eye Exam: 06/21/2023. -CBC and CMP on July 10, 2023 were normal.  Will check labs today.  Plan: CBC with Differential/Platelet, COMPLETE METABOLIC PANEL WITH GFR.  Information on immunization was placed in the AVS.  Primary osteoarthritis of both hands-she had bilateral PIP and DIP thickening with no synovitis.  Primary osteoarthritis of both knees-she had no warmth swelling or effusion.  Primary osteoarthritis of both feet-she denies any history of joint swelling.  No tenderness was noted.  Age-related osteoporosis without current pathological fracture - DEXA  12/15/2022 left femoral neck BMD 0.519 with T-score -3.0. Left total hip BMD 0.550 with T-score -3.2. -Patient is uncertain if she is taking any treatment for osteoporosis.  She  thinks that she might be on Fosamax by her PCP.  I advised her to confirm with Dr. Everlene Other.  I will obtain following labs today.  Plan: Parathyroid hormone, intact (no Ca), Phosphorus, Serum protein electrophoresis with reflex.  If patient has not getting Fosamax prescribed by her PCP then we can give her prescription for Fosamax at the follow-up visit.  Vitamin D deficiency -I will check vitamin D level today.  Plan: VITAMIN D 25 Hydroxy (Vit-D Deficiency, Fractures)  History of hyperlipidemia - Plan: Lipid panel  History of hypothyroidism - Plan: TSH  History of seizure disorder  History of anxiety  Orders: Orders Placed This Encounter  Procedures   CBC with Differential/Platelet   COMPLETE METABOLIC PANEL WITH GFR   VITAMIN D 25 Hydroxy (Vit-D Deficiency, Fractures)   Parathyroid hormone, intact (no Ca)   Phosphorus   TSH   Serum protein electrophoresis with reflex   Lipid panel   No orders of the defined types were placed in this encounter.  .  Follow-Up Instructions: Return in about 3 months (around 03/31/2024) for Rheumatoid arthritis, Osteoarthritis, Osteoporosis.   Pollyann Savoy, MD  Note - This record has been created using Animal nutritionist.  Chart creation errors have been sought, but may not always  have been located. Such creation errors do not reflect on  the standard of medical care.

## 2024-01-01 ENCOUNTER — Encounter: Payer: Self-pay | Admitting: Rheumatology

## 2024-01-01 ENCOUNTER — Ambulatory Visit: Payer: Medicare Other | Attending: Rheumatology | Admitting: Rheumatology

## 2024-01-01 VITALS — BP 136/85 | HR 80 | Resp 14 | Ht 64.0 in | Wt 108.6 lb

## 2024-01-01 DIAGNOSIS — Z8659 Personal history of other mental and behavioral disorders: Secondary | ICD-10-CM

## 2024-01-01 DIAGNOSIS — E559 Vitamin D deficiency, unspecified: Secondary | ICD-10-CM

## 2024-01-01 DIAGNOSIS — M19072 Primary osteoarthritis, left ankle and foot: Secondary | ICD-10-CM

## 2024-01-01 DIAGNOSIS — M170 Bilateral primary osteoarthritis of knee: Secondary | ICD-10-CM

## 2024-01-01 DIAGNOSIS — Z8669 Personal history of other diseases of the nervous system and sense organs: Secondary | ICD-10-CM

## 2024-01-01 DIAGNOSIS — Z8639 Personal history of other endocrine, nutritional and metabolic disease: Secondary | ICD-10-CM

## 2024-01-01 DIAGNOSIS — M0579 Rheumatoid arthritis with rheumatoid factor of multiple sites without organ or systems involvement: Secondary | ICD-10-CM | POA: Diagnosis not present

## 2024-01-01 DIAGNOSIS — M19041 Primary osteoarthritis, right hand: Secondary | ICD-10-CM

## 2024-01-01 DIAGNOSIS — Z79899 Other long term (current) drug therapy: Secondary | ICD-10-CM | POA: Diagnosis not present

## 2024-01-01 DIAGNOSIS — M19042 Primary osteoarthritis, left hand: Secondary | ICD-10-CM

## 2024-01-01 DIAGNOSIS — M19071 Primary osteoarthritis, right ankle and foot: Secondary | ICD-10-CM

## 2024-01-01 DIAGNOSIS — M810 Age-related osteoporosis without current pathological fracture: Secondary | ICD-10-CM

## 2024-01-01 NOTE — Patient Instructions (Signed)
Vaccines You are taking a medication(s) that can suppress your immune system.  The following immunizations are recommended: Flu annually Covid-19  Td/Tdap (tetanus, diphtheria, pertussis) every 10 years Pneumonia (Prevnar 15 then Pneumovax 23 at least 1 year apart.  Alternatively, can take Prevnar 20 without needing additional dose) Shingrix: 2 doses from 4 weeks to 6 months apart  Please check with your PCP to make sure you are up to date.

## 2024-01-02 NOTE — Progress Notes (Signed)
Calcium is mildly elevated.  Patient should avoid calcium supplement.  Phosphorus is elevated.  LDL is mildly elevated.  CBC normal.  Vitamin D 45, TSH normal, SPEP pending.  Please forward results to her PCP.

## 2024-01-05 LAB — COMPLETE METABOLIC PANEL WITH GFR
AG Ratio: 1.6 (calc) (ref 1.0–2.5)
ALT: 21 U/L (ref 6–29)
AST: 23 U/L (ref 10–35)
Albumin: 4.6 g/dL (ref 3.6–5.1)
Alkaline phosphatase (APISO): 42 U/L (ref 37–153)
BUN: 14 mg/dL (ref 7–25)
CO2: 29 mmol/L (ref 20–32)
Calcium: 10.5 mg/dL — ABNORMAL HIGH (ref 8.6–10.4)
Chloride: 101 mmol/L (ref 98–110)
Creat: 1 mg/dL (ref 0.50–1.05)
Globulin: 2.9 g/dL (ref 1.9–3.7)
Glucose, Bld: 86 mg/dL (ref 65–99)
Potassium: 5.2 mmol/L (ref 3.5–5.3)
Sodium: 138 mmol/L (ref 135–146)
Total Bilirubin: 0.4 mg/dL (ref 0.2–1.2)
Total Protein: 7.5 g/dL (ref 6.1–8.1)
eGFR: 62 mL/min/{1.73_m2} (ref >=60)

## 2024-01-05 LAB — CBC WITH DIFFERENTIAL/PLATELET
Absolute Lymphocytes: 1277 {cells}/uL (ref 850–3900)
Absolute Monocytes: 750 {cells}/uL (ref 200–950)
Basophils Absolute: 50 {cells}/uL (ref 0–200)
Basophils Relative: 0.8 %
Eosinophils Absolute: 161 {cells}/uL (ref 15–500)
Eosinophils Relative: 2.6 %
HCT: 42.5 % (ref 35.0–45.0)
Hemoglobin: 13.8 g/dL (ref 11.7–15.5)
MCH: 29.6 pg (ref 27.0–33.0)
MCHC: 32.5 g/dL (ref 32.0–36.0)
MCV: 91 fL (ref 80.0–100.0)
MPV: 9.4 fL (ref 7.5–12.5)
Monocytes Relative: 12.1 %
Neutro Abs: 3962 {cells}/uL (ref 1500–7800)
Neutrophils Relative %: 63.9 %
Platelets: 311 10*3/uL (ref 140–400)
RBC: 4.67 10*6/uL (ref 3.80–5.10)
RDW: 12.2 % (ref 11.0–15.0)
Total Lymphocyte: 20.6 %
WBC: 6.2 10*3/uL (ref 3.8–10.8)

## 2024-01-05 LAB — PROTEIN ELECTROPHORESIS, SERUM, WITH REFLEX
Albumin ELP: 4.4 g/dL (ref 3.8–4.8)
Alpha 1: 0.3 g/dL (ref 0.2–0.3)
Alpha 2: 0.8 g/dL (ref 0.5–0.9)
Beta 2: 0.5 g/dL (ref 0.2–0.5)
Beta Globulin: 0.5 g/dL (ref 0.4–0.6)
Gamma Globulin: 1 g/dL (ref 0.8–1.7)
Total Protein: 7.5 g/dL (ref 6.1–8.1)

## 2024-01-05 LAB — LIPID PANEL
Cholesterol: 222 mg/dL — ABNORMAL HIGH (ref <200)
HDL: 102 mg/dL (ref >=50)
LDL Cholesterol (Calc): 104 mg/dL — ABNORMAL HIGH
Non-HDL Cholesterol (Calc): 120 mg/dL (ref <130)
Total CHOL/HDL Ratio: 2.2 (calc) (ref <5.0)
Triglycerides: 70 mg/dL (ref <150)

## 2024-01-05 LAB — PARATHYROID HORMONE, INTACT (NO CA): PTH: 20 pg/mL (ref 16–77)

## 2024-01-05 LAB — TSH: TSH: 0.47 m[IU]/L (ref 0.40–4.50)

## 2024-01-05 LAB — IFE INTERPRETATION

## 2024-01-05 LAB — VITAMIN D 25 HYDROXY (VIT D DEFICIENCY, FRACTURES): Vit D, 25-Hydroxy: 45 ng/mL (ref 30–100)

## 2024-01-05 LAB — PHOSPHORUS: Phosphorus: 5.1 mg/dL — ABNORMAL HIGH (ref 2.1–4.3)

## 2024-01-06 NOTE — Progress Notes (Signed)
IFE :A faint IgG (kappa) monoclonal immunoglobulin is detected.  Recommend evaluation by hematology.

## 2024-01-07 ENCOUNTER — Other Ambulatory Visit: Payer: Self-pay | Admitting: *Deleted

## 2024-01-07 DIAGNOSIS — R899 Unspecified abnormal finding in specimens from other organs, systems and tissues: Secondary | ICD-10-CM

## 2024-02-18 ENCOUNTER — Other Ambulatory Visit: Payer: Self-pay | Admitting: *Deleted

## 2024-02-18 ENCOUNTER — Inpatient Hospital Stay: Payer: Medicare Other

## 2024-02-18 ENCOUNTER — Encounter: Payer: Self-pay | Admitting: Hematology and Oncology

## 2024-02-18 ENCOUNTER — Inpatient Hospital Stay: Payer: Medicare Other | Attending: Hematology and Oncology | Admitting: Hematology and Oncology

## 2024-02-18 VITALS — BP 141/83 | HR 90 | Temp 97.5°F | Resp 18 | Ht 64.0 in | Wt 109.0 lb

## 2024-02-18 DIAGNOSIS — Z79899 Other long term (current) drug therapy: Secondary | ICD-10-CM | POA: Diagnosis not present

## 2024-02-18 DIAGNOSIS — D472 Monoclonal gammopathy: Secondary | ICD-10-CM | POA: Insufficient documentation

## 2024-02-18 NOTE — Assessment & Plan Note (Signed)
 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

## 2024-02-18 NOTE — Progress Notes (Signed)
 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 I do not believe her mild hypercalcemia is related to that I recommend repeat labs 3 months away from a January blood test, meaning next month I discussed the natural history of MGUS with the patient and her husband  Hypercalcemia Likely due to slight dehydration I encouraged the patient to increase oral fluid intake prior to future blood draw  Orders Placed This Encounter  Procedures   CMP (Cancer Center only)    Standing Status:   Future    Expiration Date:   02/17/2025   CBC with Differential (Cancer Center Only)    Standing Status:   Future    Expiration Date:   02/17/2025   Kappa/lambda light chains    Standing Status:   Future    Expiration Date:   02/17/2025   Multiple Myeloma Panel (SPEP&IFE w/QIG)    Standing Status:   Future    Expiration Date:   02/17/2025   Lactate dehydrogenase    Standing Status:   Future    Expiration Date:   02/17/2025   Beta 2 microglobulin, serum    Standing Status:   Future    Expiration Date:   02/17/2025    All questions were answered. The patient knows to call the clinic with any problems, questions or concerns.     Artis Delay, MD 02/18/24 11:31 AM  CHIEF COMPLAINTS/PURPOSE OF CONSULTATION:  MGUS  HISTORY OF PRESENTING ILLNESS:  Melinda Sparks 68 y.o. female is here because of recent findings of MGUS She is here accompanied by her husband The patient had background history of rheumatoid arthritis and seizure disorder She had blood work done by her rheumatologist that came back abnormal with IgG kappa She denies history of abnormal bone pain or bone fracture. Her arthritis is well-controlled with current medication Patient denies history of recurrent infection or atypical infections such as shingles  of meningitis. Denies chills, night sweats, anorexia or abnormal weight loss.  MEDICAL HISTORY:  Past Medical History:  Diagnosis Date   Allergy    Family history of colon cancer in father    Hyperlipidemia    Hypothyroidism    Rheumatoid arthritis, adult (HCC)    Seizures (HCC)    epilepsy since teenager. - last seizure 3 months ago -02-2019 [facility] husnabd at [facility] 05-20-2019   Thyroid disease     SURGICAL HISTORY: Past Surgical History:  Procedure Laterality Date   APPENDECTOMY     CLOSED REDUCTION METACARPAL WITH PERCUTANEOUS PINNING Right 05/01/2017   Procedure: CLOSED REDUCTION METACARPAL WITH PERCUTANEOUS PINNING;  Surgeon: Valeria Batman, MD;  Location: [facility] OR;  Service: Orthopedics;  Laterality: Right;   COLONOSCOPY     POLYPECTOMY      SOCIAL HISTORY: Social History   Socioeconomic History   Marital status: Married    Spouse name: Not on file   Number of children: Not on file   Years of education: Not on file   Highest education level: Not on file  Occupational History   Not on file  Tobacco Use   Smoking status: Never    Passive exposure: Never   Smokeless tobacco: Never  Vaping Use   Vaping status: Never Used  Substance and Sexual Activity   Alcohol use: No   Drug use: No   Sexual activity:  Not on file  Other Topics Concern   Not on file  Social History Narrative   Not on file   Social Drivers of Health   Financial Resource Strain: Low Risk  (01/09/2024)   Received from [facility]   Overall Financial Resource Strain (CARDIA)    Difficulty of Paying Living Expenses: Not hard at all  Food Insecurity: No Food Insecurity (01/09/2024)   Received from [facility]   Hunger Vital Sign    Worried About Running Out of Food in the Last Year: Never true    Ran Out of Food in the Last Year: Never true  Transportation Needs: No Transportation Needs (01/09/2024)   Received from [facility] - Transportation    Lack of Transportation (Medical): No     Lack of Transportation (Non-Medical): No  Physical Activity: Sufficiently Active (10/17/2023)   Received from [facility]   Exercise Vital Sign    Days of Exercise per Week: 5 days    Minutes of Exercise per Session: 30 min  Stress: No Stress Concern Present (10/17/2023)   Received from [facility] - Occupational Stress Questionnaire    Feeling of Stress : Not at all  Social Connections: Socially Integrated (10/17/2023)   Received from [facility]   Social Network    How would you rate your social network (family, work, friends)?: Good participation with social networks  Intimate Partner Violence: Not At Risk (10/17/2023)   Received from [facility]   HITS    Over the last 12 months how often did your partner physically hurt you?: Never    Over the last 12 months how often did your partner insult you or talk down to you?: Never    Over the last 12 months how often did your partner threaten you with physical harm?: Never    Over the last 12 months how often did your partner scream or curse at you?: Never    FAMILY HISTORY: Family History  Problem Relation Age of Onset   Breast cancer Mother    Hypertension Mother    Alzheimer's disease Mother    Colon cancer Father 30   Colon polyps Neg Hx    Esophageal cancer Neg Hx    Rectal cancer Neg Hx    Stomach cancer Neg Hx     ALLERGIES:  is allergic to felbamate, tiagabine, and zonisamide.  MEDICATIONS:  Current Outpatient Medications  Medication Sig Dispense Refill   BIOTIN PO Take 5,000 mcg by mouth daily.     Black Cohosh 40 MG CAPS Take 540 mg by mouth daily with supper.     CHOLINE BITARTRATE ER PO Take 1 capsule by mouth daily. (Patient not taking: Reported on 01/01/2024)     fluticasone (FLONASE) 50 MCG/ACT nasal spray Place 1-2 sprays into the nose at bedtime.      folic acid (FOLVITE) 1 MG tablet Take 1 mg by mouth daily with lunch.     hydroxychloroquine (PLAQUENIL) 200  MG tablet Take 1 tablet (200 mg total) by mouth daily. 90 tablet 0   lamoTRIgine (LAMICTAL) 200 MG tablet Take 1tabs (200 mg) in am and 2 tabs (400 mg) in pm     levETIRAcetam (KEPPRA) 500 MG tablet Take 1.5 tablets in the AM and 2 tablets in PM     levothyroxine (SYNTHROID) 88 MCG tablet Take 88 mcg by mouth daily.     Multiple Vitamins-Minerals (CENTRUM SILVER ADULT 50+) TABS Take  1 tablet by mouth daily with supper.      sertraline (ZOLOFT) 25 MG tablet Take 25 mg by mouth daily.     simvastatin (ZOCOR) 20 MG tablet Take 20 mg by mouth daily with lunch.      Trolamine Salicylate (ASPERCREME EX) Apply 1 application  topically daily as needed (muscle pain).     No current facility-administered medications for this visit.    REVIEW OF SYSTEMS:   Eyes: Denies blurriness of vision, double vision or watery eyes Ears, nose, mouth, throat, and face: Denies mucositis or sore throat Respiratory: Denies cough, dyspnea or wheezes Cardiovascular: Denies palpitation, chest discomfort or lower extremity swelling Gastrointestinal:  Denies nausea, heartburn or change in bowel habits Skin: Denies abnormal skin rashes Lymphatics: Denies new lymphadenopathy or easy bruising Neurological:Denies numbness, tingling or new weaknesses Behavioral/Psych: Mood is stable, no new changes  All other systems were reviewed with the patient and are negative.  PHYSICAL EXAMINATION: ECOG PERFORMANCE STATUS: 1 - Symptomatic but completely ambulatory  Vitals:   02/18/24 1027  BP: (!) 141/83  Pulse: 90  Resp: 18  Temp: (!) 97.5 F (36.4 C)  SpO2: 100%   Filed Weights   02/18/24 1027  Weight: 109 lb (49.4 kg)    GENERAL:alert, no distress and comfortable.  She looks thin SKIN: skin color, texture, turgor are normal, no rashes or significant lesions EYES: normal, conjunctiva are pink and non-injected, sclera clear OROPHARYNX:no exudate, no erythema and lips, buccal mucosa, and tongue normal  NECK: supple,  thyroid normal size, non-tender, without nodularity LYMPH:  no palpable lymphadenopathy in the cervical, axillary or inguinal LUNGS: clear to auscultation and percussion with normal breathing effort HEART: regular rate & rhythm and no murmurs and no lower extremity edema ABDOMEN:abdomen soft, non-tender and normal bowel sounds Musculoskeletal:no cyanosis of digits and no clubbing.  Noted significant joint deformities in her hands PSYCH: alert & oriented x 3 with fluent speech NEURO: no focal motor/sensory deficits  LABORATORY DATA:  I have reviewed the data as listed Lab Results  Component Value Date   WBC 6.2 01/01/2024   HGB 13.8 01/01/2024   HCT 42.5 01/01/2024   MCV 91.0 01/01/2024   PLT 311 01/01/2024

## 2024-02-18 NOTE — Assessment & Plan Note (Signed)
 Likely due to slight dehydration I encouraged the patient to increase oral fluid intake prior to future blood draw

## 2024-02-19 LAB — CBC WITH DIFFERENTIAL/PLATELET
Absolute Lymphocytes: 1260 {cells}/uL (ref 850–3900)
Absolute Monocytes: 885 {cells}/uL (ref 200–950)
Basophils Absolute: 40 {cells}/uL (ref 0–200)
Basophils Relative: 0.8 %
Eosinophils Absolute: 280 {cells}/uL (ref 15–500)
Eosinophils Relative: 5.6 %
HCT: 36.6 % (ref 35.0–45.0)
Hemoglobin: 12.1 g/dL (ref 11.7–15.5)
MCH: 29.7 pg (ref 27.0–33.0)
MCHC: 33.1 g/dL (ref 32.0–36.0)
MCV: 89.9 fL (ref 80.0–100.0)
MPV: 9.6 fL (ref 7.5–12.5)
Monocytes Relative: 17.7 %
Neutro Abs: 2535 {cells}/uL (ref 1500–7800)
Neutrophils Relative %: 50.7 %
Platelets: 291 10*3/uL (ref 140–400)
RBC: 4.07 10*6/uL (ref 3.80–5.10)
RDW: 11.8 % (ref 11.0–15.0)
Total Lymphocyte: 25.2 %
WBC: 5 10*3/uL (ref 3.8–10.8)

## 2024-02-19 LAB — COMPLETE METABOLIC PANEL WITH GFR
AG Ratio: 1.7 (calc) (ref 1.0–2.5)
ALT: 16 U/L (ref 6–29)
AST: 20 U/L (ref 10–35)
Albumin: 4 g/dL (ref 3.6–5.1)
Alkaline phosphatase (APISO): 39 U/L (ref 37–153)
BUN: 16 mg/dL (ref 7–25)
CO2: 27 mmol/L (ref 20–32)
Calcium: 9.2 mg/dL (ref 8.6–10.4)
Chloride: 105 mmol/L (ref 98–110)
Creat: 0.77 mg/dL (ref 0.50–1.05)
Globulin: 2.4 g/dL (ref 1.9–3.7)
Glucose, Bld: 95 mg/dL (ref 65–99)
Potassium: 4.6 mmol/L (ref 3.5–5.3)
Sodium: 139 mmol/L (ref 135–146)
Total Bilirubin: 0.3 mg/dL (ref 0.2–1.2)
Total Protein: 6.4 g/dL (ref 6.1–8.1)
eGFR: 85 mL/min/{1.73_m2} (ref >=60)

## 2024-02-19 NOTE — Progress Notes (Signed)
 CBC and CMP are normal.

## 2024-03-18 NOTE — Progress Notes (Unsigned)
 Office Visit Note  Patient: Melinda Sparks             Date of Birth: [birth year]           MRN: [id number]             PCP: Alfredia Ina, MD Referring: Alfredia Ina, MD Visit Date: 04/01/2024 Occupation: @GUAROCC @  Subjective:  Pain in both hands   History of Present Illness: Melinda Sparks is a 68 y.o. female with history of seropositive rheumatoid arthritis, osteoarthritis, and osteoporosis.  Patient remains on  Plaquenil  200 mg 1 tablet by mouth daily.  She is tolerating Plaquenil  without any side effects and has not had any gaps in therapy.  Patient reports that she has been taking meloxicam 15 mg 1 tablet daily since March 2025 for neck pain and stiffness.  She has not noticed any improvement in her symptoms while taking meloxicam.  She continues to have pain and intermittent formation involving both hands.  She has occasional discomfort in her feet which she attributes to bunion formation. Patient was started on Fosamax in February 2025 by her PCP.  She has been tolerating Fosamax without any side effects.   Activities of Daily Living:  Patient reports morning stiffness for a few minutes.   Patient Reports nocturnal pain.  Difficulty dressing/grooming: Denies Difficulty climbing stairs: Denies Difficulty getting out of chair: Denies Difficulty using hands for taps, buttons, cutlery, and/or writing: Reports  Review of Systems  Constitutional:  Positive for fatigue.  HENT:  Negative for mouth sores and mouth dryness.   Eyes:  Negative for dryness.  Respiratory:  Negative for shortness of breath.   Cardiovascular:  Negative for chest pain and palpitations.  Gastrointestinal:  Negative for blood in stool, constipation and diarrhea.  Endocrine: Negative for increased urination.  Genitourinary:  Negative for involuntary urination.  Musculoskeletal:  Positive for joint pain, joint pain, joint swelling, myalgias, muscle weakness, morning stiffness, muscle tenderness and  myalgias. Negative for gait problem.  Skin:  Negative for color change, rash, hair loss and sensitivity to sunlight.  Allergic/Immunologic: Negative for susceptible to infections.  Neurological:  Positive for dizziness. Negative for numbness and headaches.  Hematological:  Negative for swollen glands.  Psychiatric/Behavioral:  Positive for depressed mood and sleep disturbance. The patient is nervous/anxious.     PMFS History:  Patient Active Problem List   Diagnosis Date Noted   MGUS (monoclonal gammopathy of unknown significance) 02/18/2024   Hypercalcemia 02/18/2024   Primary osteoarthritis of both feet 07/24/2017   Primary osteoarthritis of both knees 07/24/2017   Fracture of metacarpal neck of right hand, closed 05/01/2017   High risk medication use 03/15/2017   Primary osteoarthritis of both hands 03/15/2017   Pain in right hand 03/15/2017   Age-related osteoporosis without current pathological fracture 03/15/2017   Rheumatoid arthritis involving multiple sites with positive rheumatoid factor + CCP MTP Erosions (HCC) 11/13/2016    Past Medical History:  Diagnosis Date   Allergy    Family history of colon cancer in father    Hyperlipidemia    Hypothyroidism    Rheumatoid arthritis, adult (HCC)    Seizures (HCC)    epilepsy since teenager. - last seizure 3 months ago -02-2019 [facility]abd [facility] 05-20-2019   Thyroid disease     Family History  Problem Relation Age of Onset   Breast cancer Mother    Hypertension Mother    Alzheimer's disease Mother    Colon cancer Father 41  Colon polyps Neg Hx    Esophageal cancer Neg Hx    Rectal cancer Neg Hx    Stomach cancer Neg Hx    Past Surgical History:  Procedure Laterality Date   APPENDECTOMY     CLOSED REDUCTION METACARPAL WITH PERCUTANEOUS PINNING Right 05/01/2017   Procedure: CLOSED REDUCTION METACARPAL WITH PERCUTANEOUS PINNING;  Surgeon: Shirlee Dotter, MD;  Location: [facility] OR;  Service: Orthopedics;  Laterality: Right;    COLONOSCOPY     POLYPECTOMY     Social History   Social History Narrative   Not on file   Immunization History  Administered Date(s) Administered   Influenza-Unspecified 09/20/2017   Janssen (J&J) SARS-COV-2 Vaccination 02/15/2020   Moderna Sars-Covid-2 Vaccination 10/12/2020   PFIZER(Purple Top)SARS-COV-2 Vaccination 04/15/2021   Pfizer Covid-19 Vaccine Bivalent Booster 92yrs & up 09/13/2021   Pfizer(Comirnaty)Fall Seasonal Vaccine 12 years and older 10/10/2023   Pneumococcal Polysaccharide-23 11/20/2011   Tdap 10/09/2017     Objective: Vital Signs: BP 123/69 (BP Location: Left Arm, Patient Position: Sitting, Cuff Size: Normal)   Pulse 76   Resp 13   Ht 5\' 4"  (1.626 m)   Wt 106 lb 12.8 oz (48.4 kg)   BMI 18.33 kg/m    Physical Exam Vitals and nursing note reviewed.  Constitutional:      Appearance: She is well-developed.  HENT:     Head: Normocephalic and atraumatic.  Eyes:     Conjunctiva/sclera: Conjunctivae normal.  Cardiovascular:     Rate and Rhythm: Normal rate and regular rhythm.     Heart sounds: Normal heart sounds.  Pulmonary:     Effort: Pulmonary effort is normal.     Breath sounds: Normal breath sounds.  Abdominal:     General: Bowel sounds are normal.     Palpations: Abdomen is soft.  Musculoskeletal:     Cervical back: Normal range of motion.  Lymphadenopathy:     Cervical: No cervical adenopathy.  Skin:    General: Skin is warm and dry.     Capillary Refill: Capillary refill takes less than 2 seconds.  Neurological:     Mental Status: She is alert and oriented to person, place, and time.  Psychiatric:        Behavior: Behavior normal.      Musculoskeletal Exam: C-spine has slightly limited range of motion with lateral rotation. Thoracic spine and lumbar spine good ROM.  Shoulder joints, elbow joints, wrist joints, MCPs, PIPs, and DIPs good ROM with no synovitis.  PIP and DIP thickening consistent with osteoarthritis of both hands.   Tenderness over the right 2nd and 3rd MCP joints with erythema but no synovitis noted.  Hip joints have good range of motion with no groin pain.  Knee joints have good range of motion with no warmth or effusion.  Ankle joints have good range of motion with no tenderness or joint swelling.  CDAI Exam: CDAI Score: -- Patient Global: --; Provider Global: -- Swollen: --; Tender: -- Joint Exam 04/01/2024   No joint exam has been documented for this visit   There is currently no information documented on the homunculus. Go to the Rheumatology activity and complete the homunculus joint exam.  Investigation: No additional findings.  Imaging: No results found.  Recent Labs: Lab Results  Component Value Date   WBC 5.6 03/31/2024   HGB 12.1 03/31/2024   PLT 265 03/31/2024   NA 139 03/31/2024   K 4.8 03/31/2024   CL 105 03/31/2024   CO2 29 03/31/2024  GLUCOSE 90 03/31/2024   BUN 17 03/31/2024   CREATININE 0.96 03/31/2024   BILITOT 0.3 03/31/2024   ALKPHOS 37 (L) 03/31/2024   AST 19 03/31/2024   ALT 15 03/31/2024   PROT 7.0 03/31/2024   ALBUMIN 4.2 03/31/2024   CALCIUM 9.6 03/31/2024   GFRAA 74 06/07/2021    Speciality Comments: PLQ Eye Exam: 06/21/2023 WNL @ [facility] follow up in 1 year Prior therapy includes: MTX (elevated creatinine)  Procedures:  No procedures performed Allergies: Felbamate, Tiagabine, and Zonisamide   Assessment / Plan:     Visit Diagnoses: Rheumatoid arthritis involving multiple sites with positive rheumatoid factor + CCP MTP Erosions [facility]): Patient presents today with increased pain and intermittent inflammation involving both hands.  She has been taking Plaquenil  200 mg 1 tablet by mouth daily as prescribed without interruption.  Patient does not feel the Plaquenil  is as effective at managing her symptoms as it once was.  No active inflammation was noted on examination today.  Plan to schedule ultrasound of both hands to assess for synovitis  prior to making any medication changes.  She will remain on Plaquenil  as prescribed for now.  High risk medication use - Plaquenil  200 mg 1 tablet by mouth daily. PLQ Eye Exam: 06/21/2023. PLQ Eye Exam: 06/21/2023 WNL @ [facility] follow up in 1 year  CBC and CMP updated 03/31/24.   Primary osteoarthritis of both hands: Patient presents today with increased pain and intermittent formation involving both hands.  On examination she has PIP and DIP thickening consistent with osteoarthritis.  She has tenderness of the right 2nd and 3rd MCP joints but no obvious synovitis was noted.  Patient does not feel that Plaquenil  is as effective at managing her symptoms as it once was.  Plan to schedule ultrasound to assess for synovitis.  Primary osteoarthritis of both knees: She has good range of motion of both knee joints on examination today.  No warmth or effusion noted.  Primary osteoarthritis of both feet: She experiences intermittent discomfort in both feet which she attributes to bunion formation.  Discussed the importance of wearing proper fitting shoes with a wide toebox.  Age-related osteoporosis without current pathological fracture - DEXA  12/15/2022 left femoral neck BMD 0.519 with T-score -3.0. Left total hip BMD 0.550 with T-score -3.2. She was started on Fosamax 70 mg 1 tablet by mouth once weekly by her PCP in February 2025.  She has been tolerating Fosamax without any side effects.  Vitamin D  deficiency: Vitamin D  was within normal limits: 45 on 01/01/2024.  MGUS (monoclonal gammopathy of unknown significance): Under care of Dr. Marton Sleeper.   Other medical conditions are listed as follows:  History of hyperlipidemia  History of hypothyroidism  History of seizure disorder  History of anxiety  Orders: No orders of the defined types were placed in this encounter.  No orders of the defined types were placed in this encounter.    Follow-Up Instructions: Return in about 5 months  (around 09/01/2024) for Rheumatoid arthritis, Osteoarthritis, Osteoporosis.   Romayne Clubs, PA-C  Note - This record has been created using Dragon software.  Chart creation errors have been sought, but may not always  have been located. Such creation errors do not reflect on  the standard of medical care.

## 2024-03-31 ENCOUNTER — Inpatient Hospital Stay: Attending: Hematology and Oncology

## 2024-03-31 DIAGNOSIS — D472 Monoclonal gammopathy: Secondary | ICD-10-CM | POA: Insufficient documentation

## 2024-03-31 LAB — CBC WITH DIFFERENTIAL (CANCER CENTER ONLY)
Abs Immature Granulocytes: 0.01 10*3/uL (ref 0.00–0.07)
Basophils Absolute: 0.1 10*3/uL (ref 0.0–0.1)
Basophils Relative: 1 %
Eosinophils Absolute: 0.2 10*3/uL (ref 0.0–0.5)
Eosinophils Relative: 3 %
HCT: 36.5 % (ref 36.0–46.0)
Hemoglobin: 12.1 g/dL (ref 12.0–15.0)
Immature Granulocytes: 0 %
Lymphocytes Relative: 20 %
Lymphs Abs: 1.1 10*3/uL (ref 0.7–4.0)
MCH: 29.7 pg (ref 26.0–34.0)
MCHC: 33.2 g/dL (ref 30.0–36.0)
MCV: 89.5 fL (ref 80.0–100.0)
Monocytes Absolute: 0.6 10*3/uL (ref 0.1–1.0)
Monocytes Relative: 10 %
Neutro Abs: 3.7 10*3/uL (ref 1.7–7.7)
Neutrophils Relative %: 66 %
Platelet Count: 265 10*3/uL (ref 150–400)
RBC: 4.08 MIL/uL (ref 3.87–5.11)
RDW: 12.5 % (ref 11.5–15.5)
WBC Count: 5.6 10*3/uL (ref 4.0–10.5)
nRBC: 0 % (ref 0.0–0.2)

## 2024-03-31 LAB — CMP (CANCER CENTER ONLY)
ALT: 15 U/L (ref 0–44)
AST: 19 U/L (ref 15–41)
Albumin: 4.2 g/dL (ref 3.5–5.0)
Alkaline Phosphatase: 37 U/L — ABNORMAL LOW (ref 38–126)
Anion gap: 5 (ref 5–15)
BUN: 17 mg/dL (ref 8–23)
CO2: 29 mmol/L (ref 22–32)
Calcium: 9.6 mg/dL (ref 8.9–10.3)
Chloride: 105 mmol/L (ref 98–111)
Creatinine: 0.96 mg/dL (ref 0.44–1.00)
GFR, Estimated: >60 mL/min (ref >60)
Glucose, Bld: 90 mg/dL (ref 70–99)
Potassium: 4.8 mmol/L (ref 3.5–5.1)
Sodium: 139 mmol/L (ref 135–145)
Total Bilirubin: 0.3 mg/dL (ref 0.0–1.2)
Total Protein: 7 g/dL (ref 6.5–8.1)

## 2024-03-31 LAB — LACTATE DEHYDROGENASE: LDH: 167 U/L (ref 98–192)

## 2024-04-01 ENCOUNTER — Encounter: Payer: Self-pay | Admitting: Physician Assistant

## 2024-04-01 ENCOUNTER — Ambulatory Visit: Payer: Medicare Other | Attending: Physician Assistant | Admitting: Physician Assistant

## 2024-04-01 VITALS — BP 123/69 | HR 76 | Resp 13 | Ht 64.0 in | Wt 106.8 lb

## 2024-04-01 DIAGNOSIS — M19042 Primary osteoarthritis, left hand: Secondary | ICD-10-CM

## 2024-04-01 DIAGNOSIS — M19071 Primary osteoarthritis, right ankle and foot: Secondary | ICD-10-CM

## 2024-04-01 DIAGNOSIS — M0579 Rheumatoid arthritis with rheumatoid factor of multiple sites without organ or systems involvement: Secondary | ICD-10-CM | POA: Diagnosis not present

## 2024-04-01 DIAGNOSIS — M19041 Primary osteoarthritis, right hand: Secondary | ICD-10-CM | POA: Diagnosis not present

## 2024-04-01 DIAGNOSIS — M170 Bilateral primary osteoarthritis of knee: Secondary | ICD-10-CM | POA: Diagnosis not present

## 2024-04-01 DIAGNOSIS — Z79899 Other long term (current) drug therapy: Secondary | ICD-10-CM

## 2024-04-01 DIAGNOSIS — D472 Monoclonal gammopathy: Secondary | ICD-10-CM

## 2024-04-01 DIAGNOSIS — M810 Age-related osteoporosis without current pathological fracture: Secondary | ICD-10-CM

## 2024-04-01 DIAGNOSIS — E559 Vitamin D deficiency, unspecified: Secondary | ICD-10-CM

## 2024-04-01 DIAGNOSIS — Z8659 Personal history of other mental and behavioral disorders: Secondary | ICD-10-CM

## 2024-04-01 DIAGNOSIS — Z8669 Personal history of other diseases of the nervous system and sense organs: Secondary | ICD-10-CM

## 2024-04-01 DIAGNOSIS — Z8639 Personal history of other endocrine, nutritional and metabolic disease: Secondary | ICD-10-CM

## 2024-04-01 DIAGNOSIS — M19072 Primary osteoarthritis, left ankle and foot: Secondary | ICD-10-CM

## 2024-04-01 LAB — KAPPA/LAMBDA LIGHT CHAINS
Kappa free light chain: 17.2 mg/L (ref 3.3–19.4)
Kappa, lambda light chain ratio: 1.47 (ref 0.26–1.65)
Lambda free light chains: 11.7 mg/L (ref 5.7–26.3)

## 2024-04-01 LAB — BETA 2 MICROGLOBULIN, SERUM: Beta-2 Microglobulin: 1.3 mg/L (ref 0.6–2.4)

## 2024-04-02 LAB — MULTIPLE MYELOMA PANEL, SERUM
Albumin SerPl Elph-Mcnc: 3.7 g/dL (ref 2.9–4.4)
Albumin/Glob SerPl: 1.3 (ref 0.7–1.7)
Alpha 1: 0.3 g/dL (ref 0.0–0.4)
Alpha2 Glob SerPl Elph-Mcnc: 0.8 g/dL (ref 0.4–1.0)
B-Globulin SerPl Elph-Mcnc: 0.9 g/dL (ref 0.7–1.3)
Gamma Glob SerPl Elph-Mcnc: 0.9 g/dL (ref 0.4–1.8)
Globulin, Total: 2.9 g/dL (ref 2.2–3.9)
IgA: 255 mg/dL (ref 87–352)
IgG (Immunoglobin G), Serum: 1075 mg/dL (ref 586–1602)
IgM (Immunoglobulin M), Srm: 71 mg/dL (ref 26–217)
Total Protein ELP: 6.6 g/dL (ref 6.0–8.5)

## 2024-04-15 ENCOUNTER — Inpatient Hospital Stay: Attending: Hematology and Oncology | Admitting: Hematology and Oncology

## 2024-04-15 ENCOUNTER — Encounter: Payer: Self-pay | Admitting: Hematology and Oncology

## 2024-04-15 VITALS — BP 130/87 | HR 85 | Temp 97.9°F | Resp 18 | Ht 64.0 in | Wt 107.6 lb

## 2024-04-15 DIAGNOSIS — D472 Monoclonal gammopathy: Secondary | ICD-10-CM | POA: Diagnosis not present

## 2024-04-15 NOTE — Progress Notes (Signed)
 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 I review multiple blood test results with her Overall, she has very faint band of IgG kappa detected on immunofixation only There are no signs of endorgan damage such as anemia, hypercalcemia or renal failure We will continue close observation I will see her again in a year for further follow-up We discussed natural history of MGUS  Orders Placed This Encounter  Procedures   CMP (Cancer Center only)    Standing Status:   Future    Expiration Date:   04/15/2025   CBC with Differential (Cancer Center Only)    Standing Status:   Future    Expiration Date:   04/15/2025   Kappa/lambda light chains    Standing Status:   Future    Expiration Date:   04/15/2025   Multiple Myeloma Panel (SPEP&IFE w/QIG)    Standing Status:   Future    Expiration Date:   04/15/2025     INTERVAL HISTORY: she returns for surveillance follow-up for diagnosis of MGUS, IgG kappa subtype Patient denies recurrent infection or bone pain We reviewed recent CBC, CMP and myeloma panel results  PHYSICAL EXAMINATION: ECOG PERFORMANCE STATUS: 0 - Asymptomatic  Vitals:   04/15/24 1154  BP: 130/87  Pulse: 85  Resp: 18  Temp: 97.9 F (36.6 C)  SpO2: 98%

## 2024-04-15 NOTE — Assessment & Plan Note (Addendum)
 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

## 2024-04-21 ENCOUNTER — Telehealth: Payer: Self-pay

## 2024-04-21 NOTE — Telephone Encounter (Signed)
 Patient called the office to see if they was supposed to be taking the Plaquenil , advised that in the last visit summary she is supposed to be taking it. Patient stated she had a bottle from 2022 an wanted to know if she would be fine to take it, advised patient that It not recommended to take that prescription at this time. Patient also asked if she was prescribed trolamine advised it was not provided  by the office here.

## 2024-08-20 NOTE — Progress Notes (Signed)
 Office Visit Note  Patient: Melinda Sparks             Date of Birth: 12/02/1956           MRN: [id number]             PCP: Pura Lenis, MD Referring: Pura Lenis, MD Visit Date: 09/03/2024 Occupation: Data Unavailable  Subjective:  Pain in hands and knees   History of Present Illness: Melinda Sparks is a 68 y.o. female with seropositive rheumatoid arthritis, osteoarthritis and osteoporosis.  She states she continues to have discomfort in her hands and decreased grip strength.  She has been taking Plaquenil  200 mg daily without any interruption.  She also takes meloxicam on as needed basis.  She has intermittent discomfort in her knee joints.  She also has some discomfort in her feet.  She states that she forgets to take Fosamax.  She states her PCP switching her from Fosamax to Prolia injections.    Activities of Daily Living:  Patient reports morning stiffness for 0 minute.   Patient Reports nocturnal pain.  Difficulty dressing/grooming: Denies Difficulty climbing stairs: Denies Difficulty getting out of chair: Denies Difficulty using hands for taps, buttons, cutlery, and/or writing: Reports  Review of Systems  Constitutional:  Positive for fatigue.  HENT:  Positive for mouth dryness. Negative for mouth sores.   Eyes:  Negative for dryness.  Respiratory:  Negative for shortness of breath.   Cardiovascular:  Negative for chest pain and palpitations.  Gastrointestinal:  Negative for blood in stool, constipation and diarrhea.  Endocrine: Negative for increased urination.  Genitourinary:  Negative for involuntary urination.  Musculoskeletal:  Positive for joint pain, gait problem, joint pain, joint swelling, muscle weakness and muscle tenderness. Negative for myalgias, morning stiffness and myalgias.  Skin:  Positive for sensitivity to sunlight. Negative for color change, rash and hair loss.  Allergic/Immunologic: Negative for susceptible to infections.  Neurological:   Positive for dizziness. Negative for headaches.  Hematological:  Negative for swollen glands.  Psychiatric/Behavioral:  Positive for sleep disturbance. Negative for depressed mood. The patient is nervous/anxious.     PMFS History:  Patient Active Problem List   Diagnosis Date Noted   MGUS (monoclonal gammopathy of unknown significance) 02/18/2024   Hypercalcemia 02/18/2024   Primary osteoarthritis of both feet 07/24/2017   Primary osteoarthritis of both knees 07/24/2017   Fracture of metacarpal neck of right hand, closed 05/01/2017   High risk medication use 03/15/2017   Primary osteoarthritis of both hands 03/15/2017   Pain in right hand 03/15/2017   Age-related osteoporosis without current pathological fracture 03/15/2017   Rheumatoid arthritis involving multiple sites with positive rheumatoid factor + CCP MTP Erosions (HCC) 11/13/2016    Past Medical History:  Diagnosis Date   Allergy    Epilepsy (HCC)    Family history of colon cancer in father    Hyperlipidemia    Hypothyroidism    Rheumatoid arthritis, adult (HCC)    Seizures (HCC)    epilepsy since teenager. - last seizure 3 months ago -02-2019 per husnabd at [facility] 05-20-2019   Thyroid disease     Family History  Problem Relation Age of Onset   Breast cancer Mother    Hypertension Mother    Alzheimer's disease Mother    Colon cancer Father 69   Colon polyps Neg Hx    Esophageal cancer Neg Hx    Rectal cancer Neg Hx    Stomach cancer Neg Hx  Past Surgical History:  Procedure Laterality Date   APPENDECTOMY     CLOSED REDUCTION METACARPAL WITH PERCUTANEOUS PINNING Right 05/01/2017   Procedure: CLOSED REDUCTION METACARPAL WITH PERCUTANEOUS PINNING;  Surgeon: Anderson Maude ORN, MD;  Location: [facility] OR;  Service: Orthopedics;  Laterality: Right;   COLONOSCOPY     POLYPECTOMY     Social History   Tobacco Use   Smoking status: Never    Passive exposure: Never   Smokeless tobacco: Never  Vaping Use   Vaping status:  Never Used  Substance Use Topics   Alcohol use: No   Drug use: No   Social History   Social History Narrative   Not on file     Immunization History  Administered Date(s) Administered   Influenza-Unspecified 09/20/2017   Janssen (J&J) SARS-COV-2 Vaccination 02/15/2020   Moderna Sars-Covid-2 Vaccination 10/12/2020   Pneumococcal Polysaccharide-23 11/20/2011   Tdap 10/09/2017     Objective: Vital Signs: BP 134/78   Pulse 71   Temp 97.9 F (36.6 C)   Resp 16   Ht 5' 4 (1.626 m)   Wt 103 lb 12.8 oz (47.1 kg)   BMI 17.82 kg/m    Physical Exam Vitals and nursing note reviewed.  Constitutional:      Appearance: She is well-developed.  HENT:     Head: Normocephalic and atraumatic.  Eyes:     Conjunctiva/sclera: Conjunctivae normal.  Cardiovascular:     Rate and Rhythm: Normal rate and regular rhythm.     Heart sounds: Normal heart sounds.  Pulmonary:     Effort: Pulmonary effort is normal.     Breath sounds: Normal breath sounds.  Abdominal:     General: Bowel sounds are normal.     Palpations: Abdomen is soft.  Musculoskeletal:     Cervical back: Normal range of motion.  Lymphadenopathy:     Cervical: No cervical adenopathy.  Skin:    General: Skin is warm and dry.     Capillary Refill: Capillary refill takes less than 2 seconds.  Neurological:     Mental Status: She is alert and oriented to person, place, and time.  Psychiatric:        Behavior: Behavior normal.      Musculoskeletal Exam: Cervical, thoracic and lumbar spine were in good range of motion.  There was no SI joint tenderness.  Shoulder joints, elbow joints, wrist joints, MCPs, PIPs and DIPs were in good range of motion with no synovitis.  Bilateral MCP thickening with ulnar deviation was noted.  PIP and DIP thickening was noted.  There was no synovitis over MCPs or PIPs.  Hip joints and knee joints were in good range of motion without any warmth swelling or effusion.  There was no tenderness over  ankles or MTPs.   CDAI Exam: CDAI Score: -- Patient Global: 20 / 100; Provider Global: 0 / 100 Swollen: --; Tender: -- Joint Exam 09/03/2024   No joint exam has been documented for this visit   There is currently no information documented on the homunculus. Go to the Rheumatology activity and complete the homunculus joint exam.  Investigation: No additional findings.  Imaging: No results found.  Recent Labs: Lab Results  Component Value Date   WBC 5.6 03/31/2024   HGB 12.1 03/31/2024   PLT 265 03/31/2024   NA 139 03/31/2024   K 4.8 03/31/2024   CL 105 03/31/2024   CO2 29 03/31/2024   GLUCOSE 90 03/31/2024   BUN 17 03/31/2024   CREATININE  0.96 03/31/2024   BILITOT 0.3 03/31/2024   ALKPHOS 37 (L) 03/31/2024   AST 19 03/31/2024   ALT 15 03/31/2024   PROT 7.0 03/31/2024   ALBUMIN 4.2 03/31/2024   CALCIUM 9.6 03/31/2024   GFRAA 74 06/07/2021    Speciality Comments: PLQ Eye Exam: 06/26/2024 WNL @ [facility] follow up in 1 year Prior therapy includes: MTX (elevated creatinine)  Procedures:  No procedures performed Allergies: Felbamate, Tiagabine, and Zonisamide   Assessment / Plan:     Visit Diagnoses: Rheumatoid arthritis involving multiple sites with positive rheumatoid factor + CCP MTP Erosions (HCC): She complains of pain and stiffness in multiple joints.  No synovitis was noted on the examination today.  She complains of decreased grip strength in her hands and intermittent swelling.  High risk medication use - Plaquenil  200 mg 1 tablet by mouth daily. PLQ Eye Exam: 06/26/2024 -March 31, 2024 CBC and CMP were stable.  Plan: CBC with Differential/Platelet, Comprehensive metabolic panel with GFR.  Will call her with the lab results.  Information reimmunization was placed in the AVS.  Annual eye examination to monitor for ocular toxicity was discussed.  Primary osteoarthritis of both hands-she has rheumatoid arthritis and osteoarthritis overlap with MCP PIP and  DIP thickening.  Ulnar deviation is also noted.  Joint protection muscle strengthening was discussed.  A handout on hand exercises was given.  Primary osteoarthritis of both knees-she complains of discomfort in her knee joints.  No warmth swelling or effusion was noted.  Lower extremity muscle strength exercises were discussed and a handout was given.  Primary osteoarthritis of both feet-she has intermittent discomfort in her feet.  No synovitis was noted.  Proper fitting shoes and feet muscle strength As we discussed.  Age-related osteoporosis without current pathological fracture - DEXA  12/15/2022 left femoral neck BMD 0.519 with T-score -3.0. Left total hip BMD 0.550 with T-score -3.2.  Patient states that she was forgetting Fosamax and Fosamax was discontinued by her PCP.  She believes that she will be starting on Prolia injections.  Use of calcium rich diet and vitamin D  was discussed.  The need for regular exercise was emphasized.  Vitamin D  deficiency-vitamin D  was 45 in January 2025.  She was advised to continue with vitamin D .  History of hyperlipidemia - January 01, 2024 LDL 104  History of seizure disorder  History of hypothyroidism  History of anxiety  MGUS (monoclonal gammopathy of unknown significance) - Under care of Dr. Lonn.  Orders: Orders Placed This Encounter  Procedures   CBC with Differential/Platelet   Comprehensive metabolic panel with GFR   No orders of the defined types were placed in this encounter.    Follow-Up Instructions: Return in about 5 months (around 02/01/2025) for Rheumatoid arthritis, Osteoarthritis.   Maya Nash, MD  Note - This record has been created using Animal nutritionist.  Chart creation errors have been sought, but may not always  have been located. Such creation errors do not reflect on  the standard of medical care.

## 2024-09-03 ENCOUNTER — Ambulatory Visit: Attending: Rheumatology | Admitting: Rheumatology

## 2024-09-03 ENCOUNTER — Encounter: Payer: Self-pay | Admitting: Rheumatology

## 2024-09-03 VITALS — BP 134/78 | HR 71 | Temp 97.9°F | Resp 16 | Ht 64.0 in | Wt 103.8 lb

## 2024-09-03 DIAGNOSIS — Z8639 Personal history of other endocrine, nutritional and metabolic disease: Secondary | ICD-10-CM

## 2024-09-03 DIAGNOSIS — M810 Age-related osteoporosis without current pathological fracture: Secondary | ICD-10-CM

## 2024-09-03 DIAGNOSIS — M0579 Rheumatoid arthritis with rheumatoid factor of multiple sites without organ or systems involvement: Secondary | ICD-10-CM | POA: Diagnosis not present

## 2024-09-03 DIAGNOSIS — Z8659 Personal history of other mental and behavioral disorders: Secondary | ICD-10-CM

## 2024-09-03 DIAGNOSIS — D472 Monoclonal gammopathy: Secondary | ICD-10-CM

## 2024-09-03 DIAGNOSIS — Z79899 Other long term (current) drug therapy: Secondary | ICD-10-CM | POA: Diagnosis not present

## 2024-09-03 DIAGNOSIS — M19041 Primary osteoarthritis, right hand: Secondary | ICD-10-CM | POA: Diagnosis not present

## 2024-09-03 DIAGNOSIS — M170 Bilateral primary osteoarthritis of knee: Secondary | ICD-10-CM

## 2024-09-03 DIAGNOSIS — M19042 Primary osteoarthritis, left hand: Secondary | ICD-10-CM

## 2024-09-03 DIAGNOSIS — Z8669 Personal history of other diseases of the nervous system and sense organs: Secondary | ICD-10-CM

## 2024-09-03 DIAGNOSIS — M19072 Primary osteoarthritis, left ankle and foot: Secondary | ICD-10-CM

## 2024-09-03 DIAGNOSIS — E559 Vitamin D deficiency, unspecified: Secondary | ICD-10-CM

## 2024-09-03 DIAGNOSIS — M19071 Primary osteoarthritis, right ankle and foot: Secondary | ICD-10-CM

## 2024-09-03 LAB — COMPREHENSIVE METABOLIC PANEL WITH GFR
AG Ratio: 1.7 (calc) (ref 1.0–2.5)
ALT: 17 U/L (ref 6–29)
AST: 22 U/L (ref 10–35)
Albumin: 4.5 g/dL (ref 3.6–5.1)
Alkaline phosphatase (APISO): 30 U/L — ABNORMAL LOW (ref 37–153)
BUN: 15 mg/dL (ref 7–25)
CO2: 29 mmol/L (ref 20–32)
Calcium: 9.7 mg/dL (ref 8.6–10.4)
Chloride: 105 mmol/L (ref 98–110)
Creat: 0.92 mg/dL (ref 0.50–1.05)
Globulin: 2.6 g/dL (ref 1.9–3.7)
Glucose, Bld: 81 mg/dL (ref 65–139)
Potassium: 4.4 mmol/L (ref 3.5–5.3)
Sodium: 140 mmol/L (ref 135–146)
Total Bilirubin: 0.4 mg/dL (ref 0.2–1.2)
Total Protein: 7.1 g/dL (ref 6.1–8.1)
eGFR: 69 mL/min/1.73m2 (ref >=60)

## 2024-09-03 LAB — CBC WITH DIFFERENTIAL/PLATELET
Absolute Lymphocytes: 1373 {cells}/uL (ref 850–3900)
Absolute Monocytes: 577 {cells}/uL (ref 200–950)
Basophils Absolute: 52 {cells}/uL (ref 0–200)
Basophils Relative: 1 %
Eosinophils Absolute: 88 {cells}/uL (ref 15–500)
Eosinophils Relative: 1.7 %
HCT: 38.3 % (ref 35.0–45.0)
Hemoglobin: 13.1 g/dL (ref 11.7–15.5)
MCH: 31.5 pg (ref 27.0–33.0)
MCHC: 34.2 g/dL (ref 32.0–36.0)
MCV: 92.1 fL (ref 80.0–100.0)
MPV: 9.1 fL (ref 7.5–12.5)
Monocytes Relative: 11.1 %
Neutro Abs: 3110 {cells}/uL (ref 1500–7800)
Neutrophils Relative %: 59.8 %
Platelets: 277 Thousand/uL (ref 140–400)
RBC: 4.16 Million/uL (ref 3.80–5.10)
RDW: 12.1 % (ref 11.0–15.0)
Total Lymphocyte: 26.4 %
WBC: 5.2 Thousand/uL (ref 3.8–10.8)

## 2024-09-03 NOTE — Patient Instructions (Signed)
 Vaccines You are taking a medication(s) that can suppress your immune system.  The following immunizations are recommended: Flu annually Covid-19  RSV Td/Tdap (tetanus, diphtheria, pertussis) every 10 years Pneumonia (Prevnar 15 then Pneumovax 23 at least 1 year apart.  Alternatively, can take Prevnar 20 without needing additional dose) Shingrix: 2 doses from 4 weeks to 6 months apart  Please check with your PCP to make sure you are up to date.    Exercises for Chronic Knee Pain Chronic knee pain is pain that lasts longer than 3 months. For most people with chronic knee pain, exercise and weight loss is an important part of treatment. Your health care provider may want you to focus on: Making the muscles that support your knee stronger. This can take pressure off your knee and reduce pain. Preventing knee stiffness. How far you can move your knee, keeping it there or making it farther. Losing weight (if this applies) to take pressure off your knee, lower your risk for injury, and make it easier for you to exercise. Your provider will help you make an exercise program that fits your needs and physical abilities. Below are simple, low-impact exercises you can do at home. Ask your provider or physical therapist how often you should do your exercise program and how many times to repeat each exercise. General safety tips  Get your provider's approval before doing any exercises. Start slowly and stop any time you feel pain. Do not exercise if your knee pain is flaring up. Warm up first. Stretching a cold muscle can cause an injury. Do 5-10 minutes of easy movement or light stretching before beginning your exercises. Do 5-10 minutes of low-impact activity (like walking or cycling) before starting strengthening exercises. Contact your provider any time you have pain during or after exercising. Exercise can cause discomfort but should not be painful. It is normal to be a little stiff or sore after  exercising. Stretching and range-of-motion exercises Front thigh stretch  Stand up straight and support your body by holding on to a chair or resting one hand on a wall. With your legs straight and close together, bend one knee to lift your heel up toward your butt. Using one hand for support, grab your ankle with your free hand. Pull your foot up closer toward your butt to feel the stretch in front of your thigh. Hold the stretch for 30 seconds. Repeat __________ times. Complete this exercise __________ times a day. Back thigh stretch  Sit on the floor with your back straight and your legs out straight in front of you. Place the palms of your hands on the floor and slide them toward your feet as you bend at the hip. Try to touch your nose to your knees and feel the stretch in the back of your thighs. Hold for 30 seconds. Repeat __________ times. Complete this exercise __________ times a day. Calf stretch  Stand facing a wall. Place the palms of your hands flat against the wall, arms extended, and lean slightly against the wall. Get into a lunge position with one leg bent at the knee and the other leg stretched out straight behind you. Keep both feet facing the wall and increase the bend in your knee while keeping the heel of the other leg flat on the ground. You should feel the stretch in your calf. Hold for 30 seconds. Repeat __________ times. Complete this exercise __________ times a day. Strengthening exercises Straight leg lift  Lie on your back with  one knee bent and the other leg out straight. Slowly lift the straight leg without bending the knee. Lift until your foot is about 12 inches (30 cm) off the floor. Hold for 3-5 seconds and slowly lower your leg. Repeat __________ times. Complete this exercise __________ times a day. Single leg dip  Stand between two chairs and put both hands on the backs of the chairs for support. Extend one leg out straight with your body weight  resting on the heel of the standing leg. Slowly bend your standing knee to dip your body to the level that is comfortable for you. Hold for 3-5 seconds. Repeat __________ times. Complete this exercise __________ times a day. Hamstring curls  Stand straight, knees close together, facing the back of a chair. Hold on to the back of a chair with both hands. Keep one leg straight. Bend the other knee while bringing the heel up toward the butt until the knee is bent at a 90-degree angle (right angle). Hold for 3-5 seconds. Repeat __________ times. Complete this exercise __________ times a day. Wall squat  Stand straight with your back, hips, and head against a wall. Step forward one foot at a time with your back still against the wall. Your feet should be 2 feet (61 cm) from the wall at shoulder width. Keeping your back, hips, and head against the wall, slide down the wall to as close to a sitting position as you can get. Hold for 5-10 seconds, then slowly slide back up. Repeat __________ times. Complete this exercise __________ times a day. Step-ups  Stand in front of a sturdy platform or stool that is about 6 inches (15 cm) high. Slowly step up with your left / right foot, keeping your knee in line with your hip and foot. Do not let your knee bend so far that you cannot see your toes. Hold on to a chair for balance, but do not use it for support. Slowly unlock your knee and lower yourself to the starting position. Repeat __________ times. Complete this exercise __________ times a day. Contact a health care provider if: Your exercises cause pain. Your pain is worse after you exercise. Your pain prevents you from doing your exercises. This information is not intended to replace advice given to you by your health care provider. Make sure you discuss any questions you have with your health care provider. Document Revised: 12/05/2022 Document Reviewed: 12/05/2022 [employer] Patient Education   2024 [employer]   Hand Exercises Hand exercises can be helpful for almost anyone. They can strengthen your hands and improve flexibility and movement. The exercises can also increase blood flow to the hands. These results can make your work and daily tasks easier for you. Hand exercises can be especially helpful for people who have joint pain from arthritis or nerve damage from using their hands over and over. These exercises can also help people who injure a hand. Exercises Most of these hand exercises are gentle stretching and motion exercises. It is usually safe to do them often throughout the day. Warming up your hands before exercise may help reduce stiffness. You can do this with gentle massage or by placing your hands in warm water for 10-15 minutes. It is normal to feel some stretching, pulling, tightness, or mild discomfort when you begin new exercises. In time, this will improve. Remember to always be careful and stop right away if you feel sudden, very bad pain or your pain gets worse. You want to  get better and be safe. Ask your health care provider which exercises are safe for you. Do exercises exactly as told by your provider and adjust them as told. Do not begin these exercises until told by your provider. Knuckle bend or claw fist  Stand or sit with your arm, hand, and all five fingers pointed straight up. Make sure to keep your wrist straight. Gently bend your fingers down toward your palm until the tips of your fingers are touching your palm. Keep your big knuckle straight and only bend the small knuckles in your fingers. Hold this position for 10 seconds. Straighten your fingers back to your starting position. Repeat this exercise 5-10 times with each hand. Full finger fist  Stand or sit with your arm, hand, and all five fingers pointed straight up. Make sure to keep your wrist straight. Gently bend your fingers into your palm until the tips of your fingers are touching  the middle of your palm. Hold this position for 10 seconds. Extend your fingers back to your starting position, stretching every joint fully. Repeat this exercise 5-10 times with each hand. Straight fist  Stand or sit with your arm, hand, and all five fingers pointed straight up. Make sure to keep your wrist straight. Gently bend your fingers at the big knuckle, where your fingers meet your hand, and at the middle knuckle. Keep the knuckle at the tips of your fingers straight and try to touch the bottom of your palm. Hold this position for 10 seconds. Extend your fingers back to your starting position, stretching every joint fully. Repeat this exercise 5-10 times with each hand. Tabletop  Stand or sit with your arm, hand, and all five fingers pointed straight up. Make sure to keep your wrist straight. Gently bend your fingers at the big knuckle, where your fingers meet your hand, as far down as you can. Keep the small knuckles in your fingers straight. Think of forming a tabletop with your fingers. Hold this position for 10 seconds. Extend your fingers back to your starting position, stretching every joint fully. Repeat this exercise 5-10 times with each hand. Finger spread  Place your hand flat on a table with your palm facing down. Make sure your wrist stays straight. Spread your fingers and thumb apart from each other as far as you can until you feel a gentle stretch. Hold this position for 10 seconds. Bring your fingers and thumb tight together again. Hold this position for 10 seconds. Repeat this exercise 5-10 times with each hand. Making circles  Stand or sit with your arm, hand, and all five fingers pointed straight up. Make sure to keep your wrist straight. Make a circle by touching the tip of your thumb to the tip of your index finger. Hold for 10 seconds. Then open your hand wide. Repeat this motion with your thumb and each of your fingers. Repeat this exercise 5-10 times with  each hand. Thumb motion  Sit with your forearm resting on a table and your wrist straight. Your thumb should be facing up toward the ceiling. Keep your fingers relaxed as you move your thumb. Lift your thumb up as high as you can toward the ceiling. Hold for 10 seconds. Bend your thumb across your palm as far as you can, reaching the tip of your thumb for the small finger (pinkie) side of your palm. Hold for 10 seconds. Repeat this exercise 5-10 times with each hand. Grip strengthening  Hold a stress ball or other  soft ball in the middle of your hand. Slowly increase the pressure, squeezing the ball as much as you can without causing pain. Think of bringing the tips of your fingers into the middle of your palm. All of your finger joints should bend when doing this exercise. Hold your squeeze for 10 seconds, then relax. Repeat this exercise 5-10 times with each hand. Contact a health care provider if: Your hand pain or discomfort gets much worse when you do an exercise. Your hand pain or discomfort does not improve within 2 hours after you exercise. If you have either of these problems, stop doing these exercises right away. Do not do them again unless your provider says that you can. Get help right away if: You develop sudden, severe hand pain or swelling. If this happens, stop doing these exercises right away. Do not do them again unless your provider says that you can. This information is not intended to replace advice given to you by your health care provider. Make sure you discuss any questions you have with your health care provider. Document Revised: 12/05/2022 Document Reviewed: 12/05/2022 [employer] Patient Education  2024 [employer].

## 2024-09-04 ENCOUNTER — Ambulatory Visit: Payer: Self-pay | Admitting: Rheumatology

## 2024-09-04 NOTE — Progress Notes (Signed)
 CBC and CMP are normal.

## 2025-02-03 ENCOUNTER — Ambulatory Visit: Admitting: Physician Assistant

## 2025-04-03 ENCOUNTER — Other Ambulatory Visit

## 2025-04-14 ENCOUNTER — Ambulatory Visit: Admitting: Hematology and Oncology
